# Patient Record
Sex: Female | Born: 1960
Health system: Southern US, Community
[De-identification: ages and names within clinical notes are randomized; demographics above are authoritative.]

## PROBLEM LIST (undated history)

## (undated) DIAGNOSIS — E079 Disorder of thyroid, unspecified: Secondary | ICD-10-CM

## (undated) DIAGNOSIS — M2550 Pain in unspecified joint: Secondary | ICD-10-CM

## (undated) DIAGNOSIS — E7800 Pure hypercholesterolemia, unspecified: Secondary | ICD-10-CM

## (undated) DIAGNOSIS — I10 Essential (primary) hypertension: Secondary | ICD-10-CM

## (undated) DIAGNOSIS — M549 Dorsalgia, unspecified: Secondary | ICD-10-CM

## (undated) HISTORY — DX: Pain in unspecified joint: M25.50

## (undated) HISTORY — DX: Dorsalgia, unspecified: M54.9

## (undated) HISTORY — PX: CHOLECYSTECTOMY: SHX55

## (undated) HISTORY — DX: Pure hypercholesterolemia, unspecified: E78.00

---

## 1998-10-30 HISTORY — PX: CYST EXCISION: SHX5701

## 2013-05-20 ENCOUNTER — Other Ambulatory Visit (HOSPITAL_COMMUNITY)
Admission: RE | Admit: 2013-05-20 | Discharge: 2013-05-20 | Disposition: A | Discharge status: Home / Self Care | Payer: BC Managed Care – PPO | Source: Ambulatory Visit | Attending: Obstetrics and Gynecology | Admitting: Obstetrics and Gynecology

## 2013-05-20 DIAGNOSIS — Z1151 Encounter for screening for human papillomavirus (HPV): Secondary | ICD-10-CM | POA: Insufficient documentation

## 2013-05-20 DIAGNOSIS — Z01419 Encounter for gynecological examination (general) (routine) without abnormal findings: Secondary | ICD-10-CM | POA: Insufficient documentation

## 2014-05-06 ENCOUNTER — Emergency Department (HOSPITAL_COMMUNITY): Payer: BC Managed Care – PPO

## 2014-05-06 ENCOUNTER — Observation Stay (HOSPITAL_COMMUNITY)
Admission: EM | Admit: 2014-05-06 | Discharge: 2014-05-07 | Disposition: A | Discharge status: Home / Self Care | Payer: BC Managed Care – PPO | Attending: Family Medicine | Admitting: Family Medicine

## 2014-05-06 ENCOUNTER — Encounter (HOSPITAL_COMMUNITY): Payer: Self-pay | Admitting: Emergency Medicine

## 2014-05-06 DIAGNOSIS — E039 Hypothyroidism, unspecified: Secondary | ICD-10-CM | POA: Diagnosis present

## 2014-05-06 DIAGNOSIS — I10 Essential (primary) hypertension: Secondary | ICD-10-CM | POA: Diagnosis present

## 2014-05-06 DIAGNOSIS — G40309 Generalized idiopathic epilepsy and epileptic syndromes, not intractable, without status epilepticus: Secondary | ICD-10-CM

## 2014-05-06 DIAGNOSIS — Z79899 Other long term (current) drug therapy: Secondary | ICD-10-CM | POA: Insufficient documentation

## 2014-05-06 DIAGNOSIS — E038 Other specified hypothyroidism: Secondary | ICD-10-CM

## 2014-05-06 DIAGNOSIS — R51 Headache: Secondary | ICD-10-CM | POA: Insufficient documentation

## 2014-05-06 DIAGNOSIS — R55 Syncope and collapse: Principal | ICD-10-CM

## 2014-05-06 HISTORY — DX: Essential (primary) hypertension: I10

## 2014-05-06 HISTORY — DX: Disorder of thyroid, unspecified: E07.9

## 2014-05-06 LAB — CBC WITH DIFFERENTIAL/PLATELET
Basophils Absolute: 0 10*3/uL (ref 0.0–0.1)
Basophils Relative: 0 % (ref 0–1)
EOS ABS: 0 10*3/uL (ref 0.0–0.7)
Eosinophils Relative: 0 % (ref 0–5)
HCT: 38.4 % (ref 36.0–46.0)
Hemoglobin: 12.9 g/dL (ref 12.0–15.0)
LYMPHS PCT: 13 % (ref 12–46)
Lymphs Abs: 1.5 10*3/uL (ref 0.7–4.0)
MCH: 31 pg (ref 26.0–34.0)
MCHC: 33.6 g/dL (ref 30.0–36.0)
MCV: 92.3 fL (ref 78.0–100.0)
MONOS PCT: 3 % (ref 3–12)
Monocytes Absolute: 0.4 10*3/uL (ref 0.1–1.0)
NEUTROS PCT: 84 % — AB (ref 43–77)
Neutro Abs: 9.3 10*3/uL — ABNORMAL HIGH (ref 1.7–7.7)
Platelets: 396 10*3/uL (ref 150–400)
RBC: 4.16 MIL/uL (ref 3.87–5.11)
RDW: 13 % (ref 11.5–15.5)
WBC: 11.2 10*3/uL — ABNORMAL HIGH (ref 4.0–10.5)

## 2014-05-06 LAB — I-STAT TROPONIN, ED: TROPONIN I, POC: 0.01 ng/mL (ref 0.00–0.08)

## 2014-05-06 LAB — BASIC METABOLIC PANEL
Anion gap: 18 — ABNORMAL HIGH (ref 5–15)
BUN: 17 mg/dL (ref 6–23)
CHLORIDE: 98 meq/L (ref 96–112)
CO2: 21 mEq/L (ref 19–32)
Calcium: 9.5 mg/dL (ref 8.4–10.5)
Creatinine, Ser: 0.78 mg/dL (ref 0.50–1.10)
GFR calc non Af Amer: >90 mL/min (ref >90)
GLUCOSE: 92 mg/dL (ref 70–99)
POTASSIUM: 3.5 meq/L — AB (ref 3.7–5.3)
Sodium: 137 mEq/L (ref 137–147)

## 2014-05-06 MED ORDER — ACETAMINOPHEN 325 MG PO TABS
650.0000 mg | ORAL_TABLET | Freq: Once | ORAL | Status: AC
  Administered 2014-05-06: 650 mg via ORAL
  Filled 2014-05-06: qty 2

## 2014-05-06 NOTE — ED Notes (Signed)
Per EMS, pt coming from work today for a syncopal episode. Pt was found by her Co-worker on the ground, pt does not remember what happened. Pt was initially confused but alert x4 upon EMS arrival. NAD at this time. Pt denies pain at this time.  HR:96 BP: 157/104 CBG:196

## 2014-05-06 NOTE — H&P (Signed)
[facility] History and Physical  Sherry Martinez MWN:[id number]:[id number] DOB: 1961-03-27 DOA: 05/06/2014  Referring physician: ER physician. PCP: No primary provider on file.   Chief Complaint: Loss of consciousness.  HPI: Sherry Martinez is a [age] y.o. female with history of hypertension and hypothyroidism had an episode of loss of consciousness while at her workplace. Patient states that around 6 PM she started loss consciousness and there was no prodromal symptoms. When she woke up she was in the ambulance. Patient did not have any focal deficits palpitations chest pain or shortness of breath prior or after the episode. She has been having some frontal headache since fall. She has never had syncopal episodes before. Patient has hypertension and her medications were recently changed last week after patient developed some lower extremity edema after being on amlodipine. Patient was changed to metoprolol. In the ER patient still looked confused but is oriented to time place and name. On exam patient is nonfocal. CT head is negative. EKG shows normal sinus rhythm. Patient had frontal headache which is improving with Tylenol. Patient has no visual symptoms or any difficulty swallowing or speaking. Patient has not had any incontinence of urine or bowels or tongue bite.  Review of Systems: As presented in the history of presenting illness, rest negative.  Past Medical History  Diagnosis Date  . Thyroid disease     hypothyroid  . Hypertension    Past Surgical History  Procedure Laterality Date  . Cholecystectomy     Social History:  reports that she has never smoked. She does not have any smokeless tobacco history on file. She reports that she drinks alcohol. She reports that she does not use illicit drugs. Where does patient live home. Can patient participate in ADLs? Yes.  Allergies  Allergen Reactions  . Augmentin [Amoxicillin-Pot Clavulanate] Hives  . Demerol [Meperidine] Hives     Family History:  Family History  Problem Relation Age of Onset  . CAD Father       Prior to Admission medications   Medication Sig Start Date End Date Taking? Authorizing Provider  hydrochlorothiazide (HYDRODIURIL) 25 MG tablet Take 25 mg by mouth daily. 04/09/14  Yes Historical Provider, MD  metoprolol succinate (TOPROL-XL) 50 MG 24 hr tablet Take 50 mg by mouth daily. 04/29/14  Yes Historical Provider, MD  PREMPHASE 0.625-5 MG TABS Take 1 tablet by mouth daily. 05/04/14  Yes Historical Provider, MD  SYNTHROID 137 MCG tablet Take 137 mcg by mouth daily. 04/14/14  Yes Historical Provider, MD    Physical Exam: Filed Vitals:   05/06/14 2200 05/06/14 2215 05/06/14 2230 05/06/14 2245  BP: 154/74 146/68 150/71 132/97  Pulse: 54 56 60 67  Temp:      TempSrc:      Resp: 14 18 14 23   Height:      Weight:      SpO2: 99% 99% 100% 98%     General:  Well-developed well-nourished.  Eyes: Anicteric no pallor. PERRLA positive.  ENT: No discharge from the ears eyes nose mouth.  Neck: No mass felt. No neck rigidity.  Cardiovascular: S1-S2 heard.  Respiratory: No rhonchi or crepitations.  Abdomen: Soft nontender bowel sounds present. No guarding or rigidity.  Skin: No rash.  Musculoskeletal: No edema.  Psychiatric: Appears normal.  Neurologic: Patient is oriented to time place and person but appears mildly sleepy and confused. Moves all extremities 5 x 5. No facial asymmetry. Tongue is midline. PERRLA positive.  Labs on Admission:  Basic Metabolic Panel:  Recent Labs Lab 05/06/14 2138  NA 137  K 3.5*  CL 98  CO2 21  GLUCOSE 92  BUN 17  CREATININE 0.78  CALCIUM 9.5   Liver Function Tests: No results found for this basename: AST, ALT, ALKPHOS, BILITOT, PROT, ALBUMIN,  in the last 168 hours No results found for this basename: LIPASE, AMYLASE,  in the last 168 hours No results found for this basename: AMMONIA,  in the last 168 hours CBC:  Recent Labs Lab  05/06/14 2138  WBC 11.2*  NEUTROABS 9.3*  HGB 12.9  HCT 38.4  MCV 92.3  PLT 396   Cardiac Enzymes: No results found for this basename: CKTOTAL, CKMB, CKMBINDEX, TROPONINI,  in the last 168 hours  BNP (last 3 results) No results found for this basename: PROBNP,  in the last 8760 hours CBG: No results found for this basename: GLUCAP,  in the last 168 hours  Radiological Exams on Admission: Dg Chest 2 View  05/06/2014   CLINICAL DATA:  Syncope.  EXAM: CHEST  2 VIEW  COMPARISON:  None.  FINDINGS: There is borderline cardiomegaly. Pulmonary vascularity is normal and the lungs are clear. No effusions. No acute osseous abnormality.  IMPRESSION: Borderline cardiomegaly.   Electronically Signed   By: Geanie CooleyJim  Maxwell M.D.   On: 05/06/2014 20:51   Ct Head Wo Contrast  05/06/2014   CLINICAL DATA:  Syncope  EXAM: CT HEAD WITHOUT CONTRAST  TECHNIQUE: Contiguous axial images were obtained from the base of the skull through the vertex without intravenous contrast.  COMPARISON:  None.  FINDINGS: The ventricles and sulci are mildly prominent for age. There is no mass, hemorrhage, extra-axial fluid collection, or midline shift. There is mild small vessel disease in the centra semiovale bilaterally. Elsewhere gray-white compartments appear normal. No acute infarct is apparent. There is ossification in the anterior falx, a finding not felt to have clinical significance. Bony calvarium appears intact. The mastoid air cells are clear. There is a small retention cyst in the inferior right maxillary antrum. There is moderate right sphenoid sinus disease with calcification in this area. There is opacification of a posterior ethmoid air cell on the right.  IMPRESSION: Mild diffuse atrophy with mild patchy periventricular small vessel disease. No intracranial mass, hemorrhage, or acute appearing infarct. There is paranasal sinus disease. Calcification in the right sphenoid sinus suggests chronicity of the paranasal sinus  disease in this region.   Electronically Signed   By: Bretta BangWilliam  Woodruff M.D.   On: 05/06/2014 20:30    EKG: Independently reviewed. Normal sinus rhythm with nonspecific ST-T changes. QTC of 444 ms. QRS of 94 ms.  Assessment/Plan Principal Problem:   HTN (hypertension) Active Problems:   Syncope   Hypothyroidism   1. Syncope - cause not clear. Observe in telemetry for any arrhythmias. Check 2-D echo. Since patient had sudden onset with confusion following the incident we will check MRI brain and EEG for any seizure-like episodes. Since patient also had recent lower extremity edema check d-dimer and if positive check CT angiogram of the chest to rule out PE. Gently hydrate and check orthostatics in a.m. and hold off HCTZ for now. 2. Hypertension - continue metoprolol and we will hold off HCTZ as patient is getting gentle hydration. 3. Hypothyroidism - continue Synthroid.    Code Status: Full code.  Family Communication: None.  Disposition Plan: Admit for observation.    Kennedie Pardoe N. [facility] Pager [phone].  If 7PM-7AM, please contact night-coverage [email or web address] Password [email or web address] 05/06/2014, 11:19  PM        

## 2014-05-06 NOTE — ED Notes (Signed)
Attempted report x1. 

## 2014-05-06 NOTE — ED Provider Notes (Signed)
CSN: [id number]     Arrival date & time 05/06/14  1914 History   First MD Initiated Contact with Patient 05/06/14 1945     Chief Complaint  Patient presents with  . Loss of Consciousness      HPI Per EMS, pt coming from work today for a syncopal episode. Pt was found by her Co-worker on the ground, pt does not remember what happened. Pt was initially confused but alert x4 upon EMS arrival. NAD at this time.  Patient complains of a headache at the present time.  Past Medical History  Diagnosis Date  . Thyroid disease     hypothyroid  . Hypertension    Past Surgical History  Procedure Laterality Date  . Cholecystectomy     No family history on file. History  Substance Use Topics  . Smoking status: Never Smoker   . Smokeless tobacco: Not on file  . Alcohol Use: Yes     Comment: social   OB History   Grav Para Term Preterm Abortions TAB SAB Ect Mult Living                 Review of Systems  All other systems reviewed and are negative  Allergies  Augmentin and Demerol  Home Medications   Prior to Admission medications   Medication Sig Start Date End Date Taking? Authorizing Provider  hydrochlorothiazide (HYDRODIURIL) 25 MG tablet Take 25 mg by mouth daily. 04/09/14  Yes Historical Provider, MD  metoprolol succinate (TOPROL-XL) 50 MG 24 hr tablet Take 50 mg by mouth daily. 04/29/14  Yes Historical Provider, MD  PREMPHASE 0.625-5 MG TABS Take 1 tablet by mouth daily. 05/04/14  Yes Historical Provider, MD  SYNTHROID 137 MCG tablet Take 137 mcg by mouth daily. 04/14/14  Yes Historical Provider, MD   BP 146/68  Pulse 56  Temp(Src) 98.1 F (36.7 C) (Oral)  Resp 18  Ht 5\' 2"  (1.575 m)  Wt 250 lb (113.399 kg)  BMI 45.71 kg/m2  SpO2 99%  LMP 04/29/2014 Physical Exam Physical Exam  Nursing note and vitals reviewed. Constitutional: She is oriented to person, place, and time. She appears well-developed and well-nourished. No distress.  HENT:  Head: Normocephalic and  atraumatic.  Eyes: Pupils are equal, round, and reactive to light.  Neck: Normal range of motion.  Cardiovascular: Normal rate and intact distal pulses.   Pulmonary/Chest: No respiratory distress.  Abdominal: Normal appearance. She exhibits no distension.  Musculoskeletal: Normal range of motion.  Neurological: She is alert and oriented to person, place, and time. No cranial nerve deficit.  no motor weakness. Skin: Skin is warm and dry. No rash noted.  Psychiatric: She has a normal mood and affect. Her behavior is normal.   ED Course  Procedures (including critical care time) Labs Review Labs Reviewed  BASIC METABOLIC PANEL - Abnormal; Notable for the following:    Potassium 3.5 (*)    Anion gap 18 (*)    All other components within normal limits  CBC WITH DIFFERENTIAL - Abnormal; Notable for the following:    WBC 11.2 (*)    Neutrophils Relative % 84 (*)    Neutro Abs 9.3 (*)    All other components within normal limits  Rosezena SensorI-STAT TROPOININ, ED    Imaging Review Dg Chest 2 View  05/06/2014   CLINICAL DATA:  Syncope.  EXAM: CHEST  2 VIEW  COMPARISON:  None.  FINDINGS: There is borderline cardiomegaly. Pulmonary vascularity is normal and the lungs are clear. No  effusions. No acute osseous abnormality.  IMPRESSION: Borderline cardiomegaly.   Electronically Signed   By: Geanie CooleyJim  Maxwell M.D.   On: 05/06/2014 20:51   Ct Head Wo Contrast  05/06/2014   CLINICAL DATA:  Syncope  EXAM: CT HEAD WITHOUT CONTRAST  TECHNIQUE: Contiguous axial images were obtained from the base of the skull through the vertex without intravenous contrast.  COMPARISON:  None.  FINDINGS: The ventricles and sulci are mildly prominent for age. There is no mass, hemorrhage, extra-axial fluid collection, or midline shift. There is mild small vessel disease in the centra semiovale bilaterally. Elsewhere gray-white compartments appear normal. No acute infarct is apparent. There is ossification in the anterior falx, a finding not  felt to have clinical significance. Bony calvarium appears intact. The mastoid air cells are clear. There is a small retention cyst in the inferior right maxillary antrum. There is moderate right sphenoid sinus disease with calcification in this area. There is opacification of a posterior ethmoid air cell on the right.  IMPRESSION: Mild diffuse atrophy with mild patchy periventricular small vessel disease. No intracranial mass, hemorrhage, or acute appearing infarct. There is paranasal sinus disease. Calcification in the right sphenoid sinus suggests chronicity of the paranasal sinus disease in this region.   Electronically Signed   By: Bretta BangWilliam  Woodruff M.D.   On: 05/06/2014 20:30     EKG Interpretation   Date/Time:  Wednesday May 06 2014 19:24:24 EDT Ventricular Rate:  59 PR Interval:  208 QRS Duration: 94 QT Interval:  448 QTC Calculation: 444 R Axis:   10 Text Interpretation:  Sinus rhythm Borderline prolonged PR interval ST  elevation, consider inferior injury No previous tracing Confirmed by  Pier Laux  MD, Angad Nabers ([id number]) on 05/06/2014 8:02:40 PM      MDM   Final diagnoses:  Syncope, unspecified syncope type        Nelia Shiobert L Anyiah Coverdale, MD 05/06/14 2238

## 2014-05-06 NOTE — ED Notes (Signed)
Patient transported to CT 

## 2014-05-07 ENCOUNTER — Observation Stay (HOSPITAL_COMMUNITY): Payer: BC Managed Care – PPO

## 2014-05-07 ENCOUNTER — Encounter (HOSPITAL_COMMUNITY): Payer: Self-pay | Admitting: Radiology

## 2014-05-07 DIAGNOSIS — G40309 Generalized idiopathic epilepsy and epileptic syndromes, not intractable, without status epilepticus: Secondary | ICD-10-CM

## 2014-05-07 DIAGNOSIS — I519 Heart disease, unspecified: Secondary | ICD-10-CM

## 2014-05-07 LAB — RAPID URINE DRUG SCREEN, HOSP PERFORMED
AMPHETAMINES: NOT DETECTED
BARBITURATES: NOT DETECTED
Benzodiazepines: NOT DETECTED
Cocaine: NOT DETECTED
Opiates: NOT DETECTED
TETRAHYDROCANNABINOL: NOT DETECTED

## 2014-05-07 LAB — D-DIMER, QUANTITATIVE (NOT AT ARMC): D DIMER QUANT: 0.84 ug{FEU}/mL — AB (ref 0.00–0.48)

## 2014-05-07 LAB — LACTIC ACID, PLASMA: Lactic Acid, Venous: 1.3 mmol/L (ref 0.5–2.2)

## 2014-05-07 LAB — CK: Total CK: 414 U/L — ABNORMAL HIGH (ref 7–177)

## 2014-05-07 LAB — TROPONIN I: Troponin I: <0.3 ng/mL (ref <0.30)

## 2014-05-07 MED ORDER — SODIUM CHLORIDE 0.9 % IV SOLN
INTRAVENOUS | Status: DC
  Administered 2014-05-07: 01:00:00 via INTRAVENOUS

## 2014-05-07 MED ORDER — IOHEXOL 350 MG/ML SOLN
80.0000 mL | Freq: Once | INTRAVENOUS | Status: AC | PRN
  Administered 2014-05-07: 80 mL via INTRAVENOUS

## 2014-05-07 MED ORDER — ACETAMINOPHEN 325 MG PO TABS: 650.0000 mg | ORAL_TABLET | ORAL | Status: DC | PRN

## 2014-05-07 MED ORDER — LEVOTHYROXINE SODIUM 137 MCG PO TABS
137.0000 ug | ORAL_TABLET | Freq: Every day | ORAL | Status: DC
  Administered 2014-05-07: 137 ug via ORAL
  Filled 2014-05-07 (×2): qty 1

## 2014-05-07 MED ORDER — ONDANSETRON HCL 4 MG/2ML IJ SOLN: 4.0000 mg | Freq: Three times a day (TID) | INTRAMUSCULAR | Status: AC | PRN

## 2014-05-07 MED ORDER — ENOXAPARIN SODIUM 40 MG/0.4ML ~~LOC~~ SOLN
40.0000 mg | SUBCUTANEOUS | Status: DC
  Administered 2014-05-07: 40 mg via SUBCUTANEOUS
  Filled 2014-05-07: qty 0.4

## 2014-05-07 MED ORDER — STROKE: EARLY STAGES OF RECOVERY BOOK
Freq: Once | Status: AC
  Administered 2014-05-07: 08:00:00
  Filled 2014-05-07: qty 1

## 2014-05-07 MED ORDER — METOPROLOL SUCCINATE ER 50 MG PO TB24
50.0000 mg | ORAL_TABLET | Freq: Every day | ORAL | Status: DC
  Administered 2014-05-07: 50 mg via ORAL
  Filled 2014-05-07: qty 1

## 2014-05-07 NOTE — Progress Notes (Signed)
Pt left AMA, risks explained to her, she stated she wanted a second opinion, she refused to wait for dc paperwork. Md made aware

## 2014-05-07 NOTE — Procedures (Signed)
History: 53 yo F with LOC of unclear etiology.   Sedation: None  Technique: This is a 17 channel routine scalp EEG performed at the bedside with bipolar and monopolar montages arranged in accordance to the international 10/20 system of electrode placement. One channel was dedicated to EKG recording.    Background: There is a well defined posterior dominant rhythm of 10 Hz that attenuates with eye opening. There are frequent discharges consisting of a series of generalized poly-spike-and wave discharges with a bifrontal predominance lasting from 1 - 4 seconds.   Photic stimulation: Physiologic driving is present  EEG Abnormalities: 1) Generalized polyspike-and-wave discharges  Clinical Interpretation: This EEG is consistent with the interictal expression of a generalized epilepsy. No seizures were recorded.   Sherry SlotMcNeill Kirkpatrick, MD [facility] [phone]  If 7pm- 7am, please page neurology on call as listed in AMION.

## 2014-05-07 NOTE — Progress Notes (Signed)
Pt in procedure at 1000, unable to perform neuro check, obtained upon arrival back to floor, WNL will continue to monitor.

## 2014-05-07 NOTE — Progress Notes (Signed)
EEG Completed; Results Pending  

## 2014-05-07 NOTE — Consult Note (Addendum)
NEURO HOSPITALIST CONSULT NOTE    Reason for Consult: syncope versus seizure.   HPI:                                                                                                                                          Sherry Martinez is an 53 y.o. female who was brought to hospital after she was noted to have a syncopal episode at work.  Patient recalls being in her office and then being in the ambulance. Patient denies having any previous episodes such as this, or episodes of lost time.  She does have a son with Epilepsy which started at age 25. She denies having febrile seizures as a child or head trauma in the past. Currently she is awake and oriented able to hold a full conversation.   Past Medical History  Diagnosis Date  . Thyroid disease     hypothyroid  . Hypertension     Past Surgical History  Procedure Laterality Date  . Cholecystectomy      Family History  Problem Relation Age of Onset  . CAD Father      Social History:  reports that she has never smoked. She does not have any smokeless tobacco history on file. She reports that she drinks alcohol. She reports that she does not use illicit drugs.  Allergies  Allergen Reactions  . Augmentin [Amoxicillin-Pot Clavulanate] Hives  . Demerol [Meperidine] Hives    MEDICATIONS:                                                                                                                     Prior to Admission:  Prescriptions prior to admission  Medication Sig Dispense Refill  . hydrochlorothiazide (HYDRODIURIL) 25 MG tablet Take 25 mg by mouth daily.      . metoprolol succinate (TOPROL-XL) 50 MG 24 hr tablet Take 50 mg by mouth daily.      Marland Kitchen PREMPHASE 0.625-5 MG TABS Take 1 tablet by mouth daily.      Marland Kitchen SYNTHROID 137 MCG tablet Take 137 mcg by mouth daily.       Scheduled: . enoxaparin (LOVENOX) injection  40 mg Subcutaneous Q24H  . levothyroxine  137 mcg Oral QAC breakfast  . metoprolol  succinate  50 mg Oral  Daily     ROS:                                                                                                                                       History obtained from the patient  General ROS: negative for - chills, fatigue, fever, night sweats, weight gain or weight loss Psychological ROS: negative for - behavioral disorder, hallucinations, memory difficulties, mood swings or suicidal ideation Ophthalmic ROS: negative for - blurry vision, double vision, eye pain or loss of vision ENT ROS: negative for - epistaxis, nasal discharge, oral lesions, sore throat, tinnitus or vertigo Allergy and Immunology ROS: negative for - hives or itchy/watery eyes Hematological and Lymphatic ROS: negative for - bleeding problems, bruising or swollen lymph nodes Endocrine ROS: negative for - galactorrhea, hair pattern changes, polydipsia/polyuria or temperature intolerance Respiratory ROS: negative for - cough, hemoptysis, shortness of breath or wheezing Cardiovascular ROS: negative for - chest pain, dyspnea on exertion, edema or irregular heartbeat Gastrointestinal ROS: negative for - abdominal pain, diarrhea, hematemesis, nausea/vomiting or stool incontinence Genito-Urinary ROS: negative for - dysuria, hematuria, incontinence or urinary frequency/urgency Musculoskeletal ROS: negative for - joint swelling or muscular weakness Neurological ROS: as noted in HPI Dermatological ROS: negative for rash and skin lesion changes   Blood pressure 137/97, pulse 63, temperature 98.4 F (36.9 C), temperature source Oral, resp. rate 18, height 5\' 2"  (1.575 m), weight 118.071 kg (260 lb 4.8 oz), last menstrual period 04/29/2014, SpO2 97.00%.   Neurologic Examination:                                                                                                      General: Mental Status: Alert, oriented, thought content appropriate.  Speech fluent without evidence of aphasia.  Able to follow  3 step commands without difficulty. Cranial Nerves: II: Discs flat bilaterally; Visual fields grossly normal, pupils equal, round, reactive to light and accommodation III,IV, VI: ptosis not present, extra-ocular motions intact bilaterally V,VII: smile symmetric, facial light touch sensation normal bilaterally VIII: hearing normal bilaterally IX,X: gag reflex present XI: bilateral shoulder shrug XII: midline tongue extension without atrophy or fasciculations  Motor: Right : Upper extremity   5/5    Left:     Upper extremity   5/5  Lower extremity   5/5     Lower extremity   5/5 Tone and bulk:normal tone throughout; no atrophy noted Sensory: Pinprick and light touch intact throughout, bilaterally Deep Tendon Reflexes:  Right: Upper  Extremity   Left: Upper extremity   biceps (C-5 to C-6) 2/4   biceps (C-5 to C-6) 2/4 tricep (C7) 2/4    triceps (C7) 2/4 Brachioradialis (C6) 2/4  Brachioradialis (C6) 2/4  Lower Extremity Lower Extremity  quadriceps (L-2 to L-4) 2/4   quadriceps (L-2 to L-4) 2/4 Achilles (S1) 0/4   Achilles (S1) 0/4  Plantars: Right: downgoing   Left: downgoing Cerebellar: normal finger-to-nose,  normal heel-to-shin test Gait: not tested.  CV: pulses palpable throughout    Lab Results: Basic Metabolic Panel:  Recent Labs Lab 05/06/14 2138  NA 137  K 3.5*  CL 98  CO2 21  GLUCOSE 92  BUN 17  CREATININE 0.78  CALCIUM 9.5    Liver Function Tests: No results found for this basename: AST, ALT, ALKPHOS, BILITOT, PROT, ALBUMIN,  in the last 168 hours No results found for this basename: LIPASE, AMYLASE,  in the last 168 hours No results found for this basename: AMMONIA,  in the last 168 hours  CBC:  Recent Labs Lab 05/06/14 2138  WBC 11.2*  NEUTROABS 9.3*  HGB 12.9  HCT 38.4  MCV 92.3  PLT 396    Cardiac Enzymes:  Recent Labs Lab 05/07/14 0102  CKTOTAL 414*  TROPONINI <0.30    Lipid Panel: No results found for this basename: CHOL, TRIG,  HDL, CHOLHDL, VLDL, LDLCALC,  in the last 168 hours  CBG: No results found for this basename: GLUCAP,  in the last 168 hours  Microbiology: No results found for this or any previous visit.  Coagulation Studies: No results found for this basename: LABPROT, INR,  in the last 72 hours  Imaging: Dg Chest 2 View  05/06/2014   CLINICAL DATA:  Syncope.  EXAM: CHEST  2 VIEW  COMPARISON:  None.  FINDINGS: There is borderline cardiomegaly. Pulmonary vascularity is normal and the lungs are clear. No effusions. No acute osseous abnormality.  IMPRESSION: Borderline cardiomegaly.   Electronically Signed   By: Geanie CooleyJim  Maxwell M.D.   On: 05/06/2014 20:51   Ct Head Wo Contrast  05/06/2014   CLINICAL DATA:  Syncope  EXAM: CT HEAD WITHOUT CONTRAST  TECHNIQUE: Contiguous axial images were obtained from the base of the skull through the vertex without intravenous contrast.  COMPARISON:  None.  FINDINGS: The ventricles and sulci are mildly prominent for age. There is no mass, hemorrhage, extra-axial fluid collection, or midline shift. There is mild small vessel disease in the centra semiovale bilaterally. Elsewhere gray-white compartments appear normal. No acute infarct is apparent. There is ossification in the anterior falx, a finding not felt to have clinical significance. Bony calvarium appears intact. The mastoid air cells are clear. There is a small retention cyst in the inferior right maxillary antrum. There is moderate right sphenoid sinus disease with calcification in this area. There is opacification of a posterior ethmoid air cell on the right.  IMPRESSION: Mild diffuse atrophy with mild patchy periventricular small vessel disease. No intracranial mass, hemorrhage, or acute appearing infarct. There is paranasal sinus disease. Calcification in the right sphenoid sinus suggests chronicity of the paranasal sinus disease in this region.   Electronically Signed   By: Bretta BangWilliam  Woodruff M.D.   On: 05/06/2014 20:30   Ct  Angio Chest Pe W/cm &/or Wo Cm  05/07/2014   CLINICAL DATA:  Syncope.  Elevated D-dimer.  EXAM: CT ANGIOGRAPHY CHEST WITH CONTRAST  TECHNIQUE: Multidetector CT imaging of the chest was performed using the standard protocol during bolus administration of  intravenous contrast. Multiplanar CT image reconstructions and MIPs were obtained to evaluate the vascular anatomy.  CONTRAST:  80mL OMNIPAQUE IOHEXOL 350 MG/ML SOLN  COMPARISON:  None.  FINDINGS: Technically adequate study with good opacification of the central and segmental pulmonary arteries. No focal filling defects. No evidence of significant pulmonary embolus.  Normal heart size. Normal caliber thoracic aorta. Great vessel origins are patent. Esophagus is decompressed. No significant lymphadenopathy in the chest. No pleural effusions. Visualized portions of the upper abdominal organs are grossly unremarkable. Visualization of lungs is limited due to respiratory motion artifact but no focal consolidation or pneumothorax is identified. Suggestion of mild dependent atelectasis in the lung bases. Degenerative changes in the thoracic spine.  Review of the MIP images confirms the above findings.  IMPRESSION: No evidence of significant pulmonary embolus.   Electronically Signed   By: Burman Nieves M.D.   On: 05/07/2014 05:41   Mr Laqueta Jean UE Contrast  05/07/2014   CLINICAL DATA:  Loss of consciousness, possible seizure.  EXAM: MRI HEAD WITHOUT AND WITH CONTRAST  TECHNIQUE: Multiplanar, multiecho pulse sequences of the brain and surrounding structures were obtained without and with intravenous contrast.  CONTRAST:  MultiHance 20 mL.  COMPARISON:  CT head 05/06/2014.  FINDINGS: No evidence for acute infarction, hemorrhage, mass lesion, hydrocephalus, or extra-axial fluid. Premature for age cerebral and cerebellar atrophy. Mild subcortical and periventricular T2 and FLAIR hyperintensities, likely chronic microvascular ischemic change. Unremarkable pituitary and  cerebellar tonsils. No cervical spine abnormality. Flow voids are maintained. No foci of chronic hemorrhage. Post infusion, there is no abnormal enhancement of the brain or meninges. Moderately advanced ossification of the falx anteriorly.  The sphenoid sinus is abnormal. There is significant filling with fluid and an associated central calcification. Slight air-fluid level also noted. Peripheral enhancement. No other significant sinus disease. Negative middle ear and mastoids. Negative orbits. Scalp soft tissues unremarkable.  IMPRESSION: Atrophy and small vessel disease.  No acute intracranial findings.  No intracranial mass lesion or areas of restricted diffusion to suggest an ictal focus.  Chronic and acute sphenoid sinusitis. ENT consultation may be warranted.  Similar appearance to prior CT.   Electronically Signed   By: Davonna Belling M.D.   On: 05/07/2014 11:22    EEG: preliminary--shows signs of idiopathic generalized epilepsy--final report to follow.    Assessment and plan per attending neurologist  Felicie Morn PA-C [facility] [phone]  05/07/2014, 12:23 PM  I have seen and evaluated the patient. I have reviewed the above note and made appropriate changes.   Assessment/Plan:  53 YO female brought to hospital after unexplained loss of consciousness. Given the amnestic duration, I do suspect seizure in the setting of her abnormal EEG. EEG shows signs of generalized epilepsy. At this time with EEG findings and episode of LOC, I would favor treating with Keppra 500 mg BID.   Recommend: 1) Keppra 500 mg BID 2) Follow up with Dr. Karel Jarvis 05/18/14 09:30AM  Patient is unable to drive, operate heavy machinery, perform activities at heights or participate in water activities until release by outpatient physician. This was discussed with the patient who expressed understanding.     Ritta Slot, MD [facility] [phone]  If 7pm- 7am, please page  neurology on call as listed in AMION.

## 2014-05-07 NOTE — Progress Notes (Signed)
UR Completed Aqil Goetting Graves-Bigelow, RN,BSN [phone]  

## 2014-05-07 NOTE — Progress Notes (Signed)
  Echocardiogram 2D Echocardiogram has been performed.  Sabena Winner, Chrissy 05/07/2014, 8:42 AM

## 2014-05-18 ENCOUNTER — Ambulatory Visit: Payer: BC Managed Care – PPO | Admitting: Neurology

## 2014-05-19 ENCOUNTER — Encounter: Payer: Self-pay | Admitting: Neurology

## 2014-05-21 NOTE — Discharge Summary (Signed)
Chief complaint - loss of consciousness. Course in the hospital - 53 year-old female with history of hypertension and hypothyroidism had an episode of loss of consciousness at her workplace and was brought to the ER. Patient had CT head which did not show anything acute and cardiac markers and EKG were unremarkable. On exam patient was nonfocal. Patient had mildly elevated d-dimer and CT angiogram of the chest was negative for pulmonary embolism. 2-D echo was unremarkable. Patient had EEG which was showing abnormal features concerning for seizures. On-call neurologist Dr. Amada JupiterKirkpatrick was consulted and patient was started on Keppra but patient left hospital AGAINST MEDICAL ADVICE as patient wanted to take a second opinion.  Discharge diagnosis -   #1. Seizures. #2. Hypertension. #3. Hypothyroidism.  Patient left hospital against medical advice.   Eduard ClosArshad N Julee Stoll

## 2014-06-10 ENCOUNTER — Other Ambulatory Visit: Payer: Self-pay | Admitting: Obstetrics and Gynecology

## 2014-06-10 DIAGNOSIS — Z1231 Encounter for screening mammogram for malignant neoplasm of breast: Secondary | ICD-10-CM

## 2014-06-15 ENCOUNTER — Ambulatory Visit: Payer: BC Managed Care – PPO

## 2014-06-24 ENCOUNTER — Ambulatory Visit
Admission: RE | Admit: 2014-06-24 | Discharge: 2014-06-24 | Disposition: A | Discharge status: Home / Self Care | Payer: BC Managed Care – PPO | Source: Ambulatory Visit | Attending: Obstetrics and Gynecology | Admitting: Obstetrics and Gynecology

## 2014-06-24 DIAGNOSIS — Z1231 Encounter for screening mammogram for malignant neoplasm of breast: Secondary | ICD-10-CM

## 2017-01-12 ENCOUNTER — Other Ambulatory Visit: Payer: Self-pay | Admitting: Family Medicine

## 2017-01-12 ENCOUNTER — Ambulatory Visit
Admission: RE | Admit: 2017-01-12 | Discharge: 2017-01-12 | Disposition: A | Discharge status: Home / Self Care | Payer: 59 | Source: Ambulatory Visit | Attending: Family Medicine | Admitting: Family Medicine

## 2017-01-12 DIAGNOSIS — W19XXXA Unspecified fall, initial encounter: Secondary | ICD-10-CM

## 2017-01-12 DIAGNOSIS — R52 Pain, unspecified: Secondary | ICD-10-CM

## 2017-11-09 ENCOUNTER — Other Ambulatory Visit: Payer: Self-pay | Admitting: Nurse Practitioner

## 2017-11-09 DIAGNOSIS — Z1231 Encounter for screening mammogram for malignant neoplasm of breast: Secondary | ICD-10-CM

## 2017-11-16 ENCOUNTER — Ambulatory Visit
Admission: RE | Admit: 2017-11-16 | Discharge: 2017-11-16 | Disposition: A | Discharge status: Home / Self Care | Payer: 59 | Source: Ambulatory Visit | Attending: Nurse Practitioner | Admitting: Nurse Practitioner

## 2017-11-16 DIAGNOSIS — Z1231 Encounter for screening mammogram for malignant neoplasm of breast: Secondary | ICD-10-CM

## 2017-11-27 ENCOUNTER — Other Ambulatory Visit: Payer: Self-pay | Admitting: Obstetrics and Gynecology

## 2017-11-27 ENCOUNTER — Other Ambulatory Visit (HOSPITAL_COMMUNITY)
Admission: RE | Admit: 2017-11-27 | Discharge: 2017-11-27 | Disposition: A | Discharge status: Home / Self Care | Payer: 59 | Source: Ambulatory Visit | Attending: Obstetrics and Gynecology | Admitting: Obstetrics and Gynecology

## 2017-11-27 DIAGNOSIS — Z124 Encounter for screening for malignant neoplasm of cervix: Secondary | ICD-10-CM | POA: Diagnosis present

## 2017-11-29 LAB — CYTOLOGY - PAP
DIAGNOSIS: NEGATIVE
HPV: NOT DETECTED

## 2018-08-23 ENCOUNTER — Other Ambulatory Visit: Payer: Self-pay | Admitting: Nurse Practitioner

## 2018-08-23 ENCOUNTER — Ambulatory Visit
Admission: RE | Admit: 2018-08-23 | Discharge: 2018-08-23 | Disposition: A | Discharge status: Home / Self Care | Payer: 59 | Source: Ambulatory Visit | Attending: Nurse Practitioner | Admitting: Nurse Practitioner

## 2018-08-23 DIAGNOSIS — M5386 Other specified dorsopathies, lumbar region: Secondary | ICD-10-CM

## 2018-08-31 ENCOUNTER — Ambulatory Visit
Admission: RE | Admit: 2018-08-31 | Discharge: 2018-08-31 | Disposition: A | Discharge status: Home / Self Care | Payer: 59 | Source: Ambulatory Visit | Attending: Nurse Practitioner | Admitting: Nurse Practitioner

## 2018-08-31 DIAGNOSIS — M5386 Other specified dorsopathies, lumbar region: Secondary | ICD-10-CM

## 2018-11-02 DIAGNOSIS — N10 Acute tubulo-interstitial nephritis: Secondary | ICD-10-CM | POA: Diagnosis not present

## 2018-11-12 DIAGNOSIS — M545 Low back pain: Secondary | ICD-10-CM | POA: Diagnosis not present

## 2018-11-12 DIAGNOSIS — M4317 Spondylolisthesis, lumbosacral region: Secondary | ICD-10-CM | POA: Diagnosis not present

## 2018-12-13 DIAGNOSIS — M4317 Spondylolisthesis, lumbosacral region: Secondary | ICD-10-CM | POA: Diagnosis not present

## 2018-12-13 DIAGNOSIS — I10 Essential (primary) hypertension: Secondary | ICD-10-CM | POA: Diagnosis not present

## 2018-12-13 DIAGNOSIS — M545 Low back pain: Secondary | ICD-10-CM | POA: Diagnosis not present

## 2018-12-13 DIAGNOSIS — Z6841 Body Mass Index (BMI) 40.0 and over, adult: Secondary | ICD-10-CM | POA: Diagnosis not present

## 2019-01-13 DIAGNOSIS — Z13220 Encounter for screening for lipoid disorders: Secondary | ICD-10-CM | POA: Diagnosis not present

## 2019-01-13 DIAGNOSIS — E039 Hypothyroidism, unspecified: Secondary | ICD-10-CM | POA: Diagnosis not present

## 2019-01-13 DIAGNOSIS — I10 Essential (primary) hypertension: Secondary | ICD-10-CM | POA: Diagnosis not present

## 2019-05-12 DIAGNOSIS — G4733 Obstructive sleep apnea (adult) (pediatric): Secondary | ICD-10-CM | POA: Diagnosis not present

## 2019-05-12 DIAGNOSIS — E782 Mixed hyperlipidemia: Secondary | ICD-10-CM | POA: Diagnosis not present

## 2019-05-12 DIAGNOSIS — E039 Hypothyroidism, unspecified: Secondary | ICD-10-CM | POA: Diagnosis not present

## 2019-05-12 DIAGNOSIS — I10 Essential (primary) hypertension: Secondary | ICD-10-CM | POA: Diagnosis not present

## 2019-05-29 DIAGNOSIS — M4317 Spondylolisthesis, lumbosacral region: Secondary | ICD-10-CM | POA: Diagnosis not present

## 2019-05-29 DIAGNOSIS — M545 Low back pain: Secondary | ICD-10-CM | POA: Diagnosis not present

## 2019-07-30 ENCOUNTER — Other Ambulatory Visit: Payer: Self-pay | Admitting: Internal Medicine

## 2019-07-30 DIAGNOSIS — I10 Essential (primary) hypertension: Secondary | ICD-10-CM | POA: Diagnosis not present

## 2019-07-30 DIAGNOSIS — Z23 Encounter for immunization: Secondary | ICD-10-CM | POA: Diagnosis not present

## 2019-07-30 DIAGNOSIS — Z1231 Encounter for screening mammogram for malignant neoplasm of breast: Secondary | ICD-10-CM

## 2019-07-30 DIAGNOSIS — Z6841 Body Mass Index (BMI) 40.0 and over, adult: Secondary | ICD-10-CM | POA: Diagnosis not present

## 2019-07-30 DIAGNOSIS — E782 Mixed hyperlipidemia: Secondary | ICD-10-CM | POA: Diagnosis not present

## 2019-09-11 ENCOUNTER — Ambulatory Visit: Payer: 59

## 2019-11-04 DIAGNOSIS — Z01419 Encounter for gynecological examination (general) (routine) without abnormal findings: Secondary | ICD-10-CM | POA: Diagnosis not present

## 2019-11-06 ENCOUNTER — Ambulatory Visit
Admission: RE | Admit: 2019-11-06 | Discharge: 2019-11-06 | Disposition: A | Discharge status: Home / Self Care | Payer: BC Managed Care – PPO | Source: Ambulatory Visit | Attending: Internal Medicine | Admitting: Internal Medicine

## 2019-11-06 ENCOUNTER — Other Ambulatory Visit: Payer: Self-pay

## 2019-11-06 DIAGNOSIS — Z1231 Encounter for screening mammogram for malignant neoplasm of breast: Secondary | ICD-10-CM

## 2020-02-25 ENCOUNTER — Other Ambulatory Visit: Payer: Self-pay

## 2020-02-25 ENCOUNTER — Ambulatory Visit: Payer: BC Managed Care – PPO | Attending: Internal Medicine

## 2020-02-25 DIAGNOSIS — Z23 Encounter for immunization: Secondary | ICD-10-CM

## 2020-02-25 NOTE — Progress Notes (Signed)
   Covid-19 Vaccination Clinic  Name:  Sherry Martinez    MRN: [id number] DOB: 10-Dec-1960  02/25/2020  Sherry Martinez was observed post Covid-19 immunization for 15 minutes without incident. She was provided with Vaccine Information Sheet and instruction to access the V-Safe system.   Sherry Martinez was instructed to call 911 with any severe reactions post vaccine: Marland Kitchen Difficulty breathing  . Swelling of face and throat  . A fast heartbeat  . A bad rash all over body  . Dizziness and weakness   Immunizations Administered    Name Date Dose VIS Date Route   Pfizer COVID-19 Vaccine 02/25/2020  3:41 PM 0.3 mL 12/24/2018 Intramuscular   Manufacturer: [employer], [employer]   Lot: [id number]   NDC: [address]-5

## 2020-03-16 ENCOUNTER — Ambulatory Visit: Payer: BC Managed Care – PPO | Attending: Internal Medicine

## 2020-03-16 DIAGNOSIS — Z23 Encounter for immunization: Secondary | ICD-10-CM

## 2020-03-16 NOTE — Progress Notes (Signed)
   Covid-19 Vaccination Clinic  Name:  [name]    MRN: [id number] DOB: [birth year]  03/16/2020  [name] was observed post Covid-19 immunization for 15 minutes without incident. She was provided with Vaccine Information Sheet and instruction to access the V-Safe system.   [name] was instructed to call 911 with any severe reactions post vaccine: Marland Kitchen Difficulty breathing  . Swelling of face and throat  . A fast heartbeat  . A bad rash all over body  . Dizziness and weakness   Immunizations Administered    Name Date Dose VIS Date Route   Pfizer COVID-19 Vaccine 03/16/2020  1:07 PM 0.3 mL 12/24/2018 Intramuscular   Manufacturer: [employer], [employer]   Lot: [id number]   NDC: [address]-3

## 2020-05-17 ENCOUNTER — Other Ambulatory Visit: Payer: Self-pay

## 2020-05-17 ENCOUNTER — Ambulatory Visit (HOSPITAL_COMMUNITY)
Admission: EM | Admit: 2020-05-17 | Discharge: 2020-05-17 | Disposition: A | Discharge status: Home / Self Care | Payer: BC Managed Care – PPO | Attending: Family Medicine | Admitting: Family Medicine

## 2020-05-17 ENCOUNTER — Encounter (HOSPITAL_COMMUNITY): Payer: Self-pay

## 2020-05-17 ENCOUNTER — Ambulatory Visit (INDEPENDENT_AMBULATORY_CARE_PROVIDER_SITE_OTHER): Payer: BC Managed Care – PPO

## 2020-05-17 DIAGNOSIS — S62627D Displaced fracture of medial phalanx of left little finger, subsequent encounter for fracture with routine healing: Secondary | ICD-10-CM | POA: Diagnosis not present

## 2020-05-17 DIAGNOSIS — S63286D Dislocation of proximal interphalangeal joint of right little finger, subsequent encounter: Secondary | ICD-10-CM | POA: Diagnosis not present

## 2020-05-17 DIAGNOSIS — S6981XA Other specified injuries of right wrist, hand and finger(s), initial encounter: Secondary | ICD-10-CM

## 2020-05-17 DIAGNOSIS — S63296A Dislocation of distal interphalangeal joint of right little finger, initial encounter: Secondary | ICD-10-CM

## 2020-05-17 DIAGNOSIS — S63639A Sprain of interphalangeal joint of unspecified finger, initial encounter: Secondary | ICD-10-CM | POA: Diagnosis not present

## 2020-05-17 DIAGNOSIS — I10 Essential (primary) hypertension: Secondary | ICD-10-CM | POA: Diagnosis not present

## 2020-05-17 DIAGNOSIS — S63286A Dislocation of proximal interphalangeal joint of right little finger, initial encounter: Secondary | ICD-10-CM | POA: Diagnosis not present

## 2020-05-17 DIAGNOSIS — S63259A Unspecified dislocation of unspecified finger, initial encounter: Secondary | ICD-10-CM | POA: Diagnosis not present

## 2020-05-17 NOTE — ED Triage Notes (Signed)
Pt states she fell this morning onto her right side on pavement and right hand got caught under a large trash can. Pt c/o pain to right 5th finger. Denies head injury/LOC. Right pinky finger grossly displaced with small area of blood noted to medial aspect. Denies pain to other fingers/hand.

## 2020-05-17 NOTE — Discharge Instructions (Addendum)
Wear your finger splint for the next few days. You may then remove this and begin to work on moving your finger. If not allergic, you may use over the counter ibuprofen or acetaminophen as needed for discomfort.   Your blood pressure was noted to be elevated during your visit today. If you are currently taking medication for high blood pressure, please ensure you are taking this as directed. If you do not have a history of high blood pressure and your blood pressure remains persistently elevated, you may need to begin taking a medication at some point. You may return here within the next few days to recheck if unable to see your primary care provider or if do not have a one.  BP (!) 175/123 (BP Location: Left Wrist)   Pulse 70   Temp 99.2 F (37.3 C) (Oral)   Resp 18   LMP 04/29/2014   SpO2 100%

## 2020-05-17 NOTE — ED Provider Notes (Signed)
[facility]   [id number] 05/17/20 Arrival Time: 0160  ASSESSMENT & PLAN:  1. Dislocation of finger, initial encounter   2. Volar plate injury of finger, initial encounter   3. Elevated blood pressure reading in office with diagnosis of hypertension     I have personally viewed the imaging studies ordered this visit. R fifth finger dislocation at PIP. Post-reduction with apparent volar plate/avulsion injury.  Procedure: Skin of R fifth finger cleaned. Using 1% lidocaine, a digital block was performed. Finger dislocation reduced with gentle traction. No complications.  Finger splint applied. To wear for next few days then work on ROM. OTC analgesics if needed.  Recommend:  Follow-up Information    Dominica Severin, MD.   Specialty: Orthopedic Surgery Why: If worsening or failing to improve as anticipated. Contact information: [address] [place] [address] [phone]                 Discharge Instructions     Wear your finger splint for the next few days. You may then remove this and begin to work on moving your finger. If not allergic, you may use over the counter ibuprofen or acetaminophen as needed for discomfort.   Your blood pressure was noted to be elevated during your visit today. If you are currently taking medication for high blood pressure, please ensure you are taking this as directed. If you do not have a history of high blood pressure and your blood pressure remains persistently elevated, you may need to begin taking a medication at some point. You may return here within the next few days to recheck if unable to see your primary care provider or if do not have a one.  BP (!) 175/123 (BP Location: Left Wrist)   Pulse 70   Temp 99.2 F (37.3 C) (Oral)   Resp 18   LMP 04/29/2014   SpO2 100%        Reviewed expectations re: course of current medical issues. Questions answered. Outlined signs and symptoms indicating  need for more acute intervention. Patient verbalized understanding. After Visit Summary given.  SUBJECTIVE: History from: patient. Sherry Martinez is a 59 y.o. female who reports falling today; hand vs trash can; "finger bent" now. Is painful. Cannot flex R 5th finger. No sensation changes.  Past Surgical History:  Procedure Laterality Date  . CHOLECYSTECTOMY       Increased blood pressure noted today. Reports that she is treated for HTN.  She reports taking medications as instructed, no chest pain on exertion, no dyspnea on exertion, no swelling of ankles, no orthostatic dizziness or lightheadedness, no orthopnea or paroxysmal nocturnal dyspnea and no palpitations.  OBJECTIVE:  Vitals:   05/17/20 0934  BP: (!) 175/123  Pulse: 70  Resp: 18  Temp: 99.2 F (37.3 C)  TempSrc: Oral  SpO2: 100%    General appearance: alert; no distress HEENT: [place]; AT Neck: supple with FROM Resp: unlabored respirations Extremities: . RUE: warm with well perfused appearance; obvious 5th finger dislocation with finger protruding at angle at PIP; very TTP; able to flex finger after finger reduction CV: brisk extremity capillary refill of RUE; 2+ radial pulse of RUE. Skin: warm and dry; no visible rashes Neurologic: gait normal; normal sensation and strength of RUE Psychological: alert and cooperative; normal mood and affect  Imaging: DG Hand Complete Right  Result Date: 05/17/2020 CLINICAL DATA:  Recent fall with fifth digit deformity, initial encounter EXAM: RIGHT HAND - COMPLETE 3+ VIEW COMPARISON:  None. FINDINGS: There is posterior dislocation of the fifth middle phalanx at the level of the PIP joint. No definitive fracture is seen. No other focal abnormality is noted. IMPRESSION: Posterior dislocation at the fifth PIP joint. Electronically Signed   By: Alcide Clever M.D.   On: 05/17/2020 09:59   DG Finger Little Right  Result Date: 05/17/2020 CLINICAL DATA:  Status post reduction of PIP  dislocation. EXAM: RIGHT LITTLE FINGER 2+V COMPARISON:  05/17/2020 at 9:50 a.m. FINDINGS: The small finger PIP joint dislocation on the prior study has been reduced. There is a 1 mm bone fragment at the volar aspect of the PIP joint. IMPRESSION: Interval reduction of small finger PIP joint dislocation. Tiny fracture fragment at the volar aspect of the PIP joint. Electronically Signed   By: Sebastian Ache M.D.   On: 05/17/2020 10:56      Allergies  Allergen Reactions  . Augmentin [Amoxicillin-Pot Clavulanate] Hives  . Demerol [Meperidine] Hives    Past Medical History:  Diagnosis Date  . Hypertension   . Thyroid disease    hypothyroid   Social History   Socioeconomic History  . Marital status: Single    Spouse name: Not on file  . Number of children: Not on file  . Years of education: Not on file  . Highest education level: Not on file  Occupational History  . Not on file  Tobacco Use  . Smoking status: Never Smoker  Vaping Use  . Vaping Use: Never used  Substance and Sexual Activity  . Alcohol use: Yes    Comment: social  . Drug use: No  . Sexual activity: Not on file  Other Topics Concern  . Not on file  Social History Narrative  . Not on file   Social Determinants of Health   Financial Resource Strain:   . Difficulty of Paying Living Expenses:   Food Insecurity:   . Worried About Programme researcher, broadcasting/film/video in the Last Year:   . Barista in the Last Year:   Transportation Needs:   . Freight forwarder (Medical):   Marland Kitchen Lack of Transportation (Non-Medical):   Physical Activity:   . Days of Exercise per Week:   . Minutes of Exercise per Session:   Stress:   . Feeling of Stress :   Social Connections:   . Frequency of Communication with Friends and Family:   . Frequency of Social Gatherings with Friends and Family:   . Attends Religious Services:   . Active Member of Clubs or Organizations:   . Attends Banker Meetings:   Marland Kitchen Marital Status:     Family History  Problem Relation Age of Onset  . CAD Father    Past Surgical History:  Procedure Laterality Date  . Barrington Ellison, MD 05/17/20 1122

## 2020-05-17 NOTE — ED Notes (Signed)
Offered pt opportunity to request pain reliever such as ibuprofen/tylenol from provider. Pt declined at this time.

## 2020-08-02 DIAGNOSIS — E782 Mixed hyperlipidemia: Secondary | ICD-10-CM | POA: Diagnosis not present

## 2020-08-02 DIAGNOSIS — Z0000 Encounter for general adult medical examination without abnormal findings: Secondary | ICD-10-CM | POA: Diagnosis not present

## 2020-08-02 DIAGNOSIS — Z23 Encounter for immunization: Secondary | ICD-10-CM | POA: Diagnosis not present

## 2020-09-16 DIAGNOSIS — M4317 Spondylolisthesis, lumbosacral region: Secondary | ICD-10-CM | POA: Diagnosis not present

## 2020-09-16 DIAGNOSIS — Z6841 Body Mass Index (BMI) 40.0 and over, adult: Secondary | ICD-10-CM | POA: Diagnosis not present

## 2020-09-16 DIAGNOSIS — I10 Essential (primary) hypertension: Secondary | ICD-10-CM | POA: Diagnosis not present

## 2020-10-07 DIAGNOSIS — M48061 Spinal stenosis, lumbar region without neurogenic claudication: Secondary | ICD-10-CM | POA: Diagnosis not present

## 2020-10-07 DIAGNOSIS — M4317 Spondylolisthesis, lumbosacral region: Secondary | ICD-10-CM | POA: Diagnosis not present

## 2020-10-07 DIAGNOSIS — M5416 Radiculopathy, lumbar region: Secondary | ICD-10-CM | POA: Diagnosis not present

## 2020-11-22 DIAGNOSIS — M5416 Radiculopathy, lumbar region: Secondary | ICD-10-CM | POA: Diagnosis not present

## 2020-12-07 DIAGNOSIS — Z1211 Encounter for screening for malignant neoplasm of colon: Secondary | ICD-10-CM | POA: Diagnosis not present

## 2020-12-28 DIAGNOSIS — M5416 Radiculopathy, lumbar region: Secondary | ICD-10-CM | POA: Diagnosis not present

## 2020-12-28 DIAGNOSIS — M48061 Spinal stenosis, lumbar region without neurogenic claudication: Secondary | ICD-10-CM | POA: Diagnosis not present

## 2021-02-01 DIAGNOSIS — M5416 Radiculopathy, lumbar region: Secondary | ICD-10-CM | POA: Diagnosis not present

## 2021-02-22 DIAGNOSIS — M48061 Spinal stenosis, lumbar region without neurogenic claudication: Secondary | ICD-10-CM | POA: Diagnosis not present

## 2021-02-22 DIAGNOSIS — M5416 Radiculopathy, lumbar region: Secondary | ICD-10-CM | POA: Diagnosis not present

## 2021-03-06 DIAGNOSIS — N3000 Acute cystitis without hematuria: Secondary | ICD-10-CM | POA: Diagnosis not present

## 2021-04-07 DIAGNOSIS — M5416 Radiculopathy, lumbar region: Secondary | ICD-10-CM | POA: Diagnosis not present

## 2021-05-13 DIAGNOSIS — M48061 Spinal stenosis, lumbar region without neurogenic claudication: Secondary | ICD-10-CM | POA: Diagnosis not present

## 2021-05-13 DIAGNOSIS — I10 Essential (primary) hypertension: Secondary | ICD-10-CM | POA: Diagnosis not present

## 2021-05-13 DIAGNOSIS — Z6841 Body Mass Index (BMI) 40.0 and over, adult: Secondary | ICD-10-CM | POA: Diagnosis not present

## 2021-05-13 DIAGNOSIS — M5416 Radiculopathy, lumbar region: Secondary | ICD-10-CM | POA: Diagnosis not present

## 2021-06-07 DIAGNOSIS — M5416 Radiculopathy, lumbar region: Secondary | ICD-10-CM | POA: Diagnosis not present

## 2021-07-05 DIAGNOSIS — I10 Essential (primary) hypertension: Secondary | ICD-10-CM | POA: Diagnosis not present

## 2021-07-05 DIAGNOSIS — M5416 Radiculopathy, lumbar region: Secondary | ICD-10-CM | POA: Diagnosis not present

## 2021-07-06 ENCOUNTER — Other Ambulatory Visit: Payer: Self-pay | Admitting: Pain Medicine

## 2021-07-06 DIAGNOSIS — M5416 Radiculopathy, lumbar region: Secondary | ICD-10-CM

## 2021-07-30 ENCOUNTER — Other Ambulatory Visit: Payer: Self-pay

## 2021-07-30 ENCOUNTER — Ambulatory Visit
Admission: RE | Admit: 2021-07-30 | Discharge: 2021-07-30 | Disposition: A | Discharge status: Home / Self Care | Payer: BC Managed Care – PPO | Source: Ambulatory Visit | Attending: Pain Medicine | Admitting: Pain Medicine

## 2021-07-30 DIAGNOSIS — M48061 Spinal stenosis, lumbar region without neurogenic claudication: Secondary | ICD-10-CM | POA: Diagnosis not present

## 2021-07-30 DIAGNOSIS — M5416 Radiculopathy, lumbar region: Secondary | ICD-10-CM

## 2021-07-30 DIAGNOSIS — M545 Low back pain, unspecified: Secondary | ICD-10-CM | POA: Diagnosis not present

## 2021-08-02 DIAGNOSIS — M5416 Radiculopathy, lumbar region: Secondary | ICD-10-CM | POA: Diagnosis not present

## 2021-08-11 DIAGNOSIS — M5416 Radiculopathy, lumbar region: Secondary | ICD-10-CM | POA: Diagnosis not present

## 2021-09-28 DIAGNOSIS — M5416 Radiculopathy, lumbar region: Secondary | ICD-10-CM | POA: Diagnosis not present

## 2021-09-28 DIAGNOSIS — G894 Chronic pain syndrome: Secondary | ICD-10-CM | POA: Diagnosis not present

## 2021-11-02 DIAGNOSIS — E039 Hypothyroidism, unspecified: Secondary | ICD-10-CM | POA: Diagnosis not present

## 2021-11-02 DIAGNOSIS — Z23 Encounter for immunization: Secondary | ICD-10-CM | POA: Diagnosis not present

## 2021-11-02 DIAGNOSIS — Z1231 Encounter for screening mammogram for malignant neoplasm of breast: Secondary | ICD-10-CM | POA: Diagnosis not present

## 2021-11-02 DIAGNOSIS — I10 Essential (primary) hypertension: Secondary | ICD-10-CM | POA: Diagnosis not present

## 2021-11-04 ENCOUNTER — Other Ambulatory Visit: Payer: Self-pay | Admitting: Internal Medicine

## 2021-11-04 DIAGNOSIS — Z1231 Encounter for screening mammogram for malignant neoplasm of breast: Secondary | ICD-10-CM

## 2021-11-09 ENCOUNTER — Other Ambulatory Visit: Payer: Self-pay

## 2021-11-09 ENCOUNTER — Ambulatory Visit
Admission: RE | Admit: 2021-11-09 | Discharge: 2021-11-09 | Disposition: A | Discharge status: Home / Self Care | Payer: BC Managed Care – PPO | Source: Ambulatory Visit | Attending: Internal Medicine | Admitting: Internal Medicine

## 2021-11-09 DIAGNOSIS — Z1231 Encounter for screening mammogram for malignant neoplasm of breast: Secondary | ICD-10-CM

## 2021-11-22 DIAGNOSIS — M5416 Radiculopathy, lumbar region: Secondary | ICD-10-CM | POA: Diagnosis not present

## 2021-12-20 DIAGNOSIS — M48061 Spinal stenosis, lumbar region without neurogenic claudication: Secondary | ICD-10-CM | POA: Diagnosis not present

## 2021-12-20 DIAGNOSIS — M5416 Radiculopathy, lumbar region: Secondary | ICD-10-CM | POA: Diagnosis not present

## 2021-12-20 DIAGNOSIS — G894 Chronic pain syndrome: Secondary | ICD-10-CM | POA: Diagnosis not present

## 2022-02-04 IMAGING — MG MM DIGITAL SCREENING BILAT W/ TOMO AND CAD
6 of 12 series · 6 of 36 positions shown · non-contrast
Comparison: Previous exam(s).

ACR Breast Density Category a: The breast tissue is almost entirely
fatty.

CLINICAL DATA: Screening.

EXAM:
DIGITAL SCREENING BILATERAL MAMMOGRAM WITH TOMOSYNTHESIS AND CAD
TECHNIQUE: Bilateral screening digital craniocaudal and mediolateral oblique
mammograms were obtained. Bilateral screening digital breast
tomosynthesis was performed. The images were evaluated with
computer-aided detection.

[L MLO synth-2D (1 of 2)]
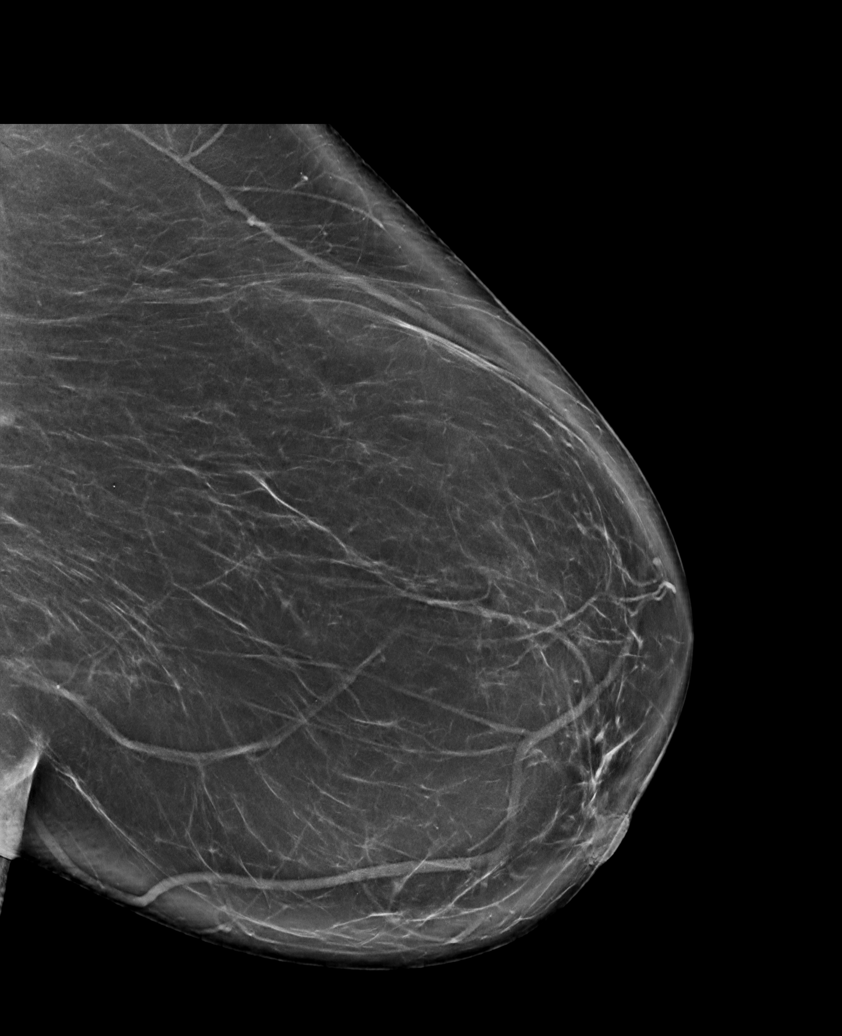

[L CC synth-2D]
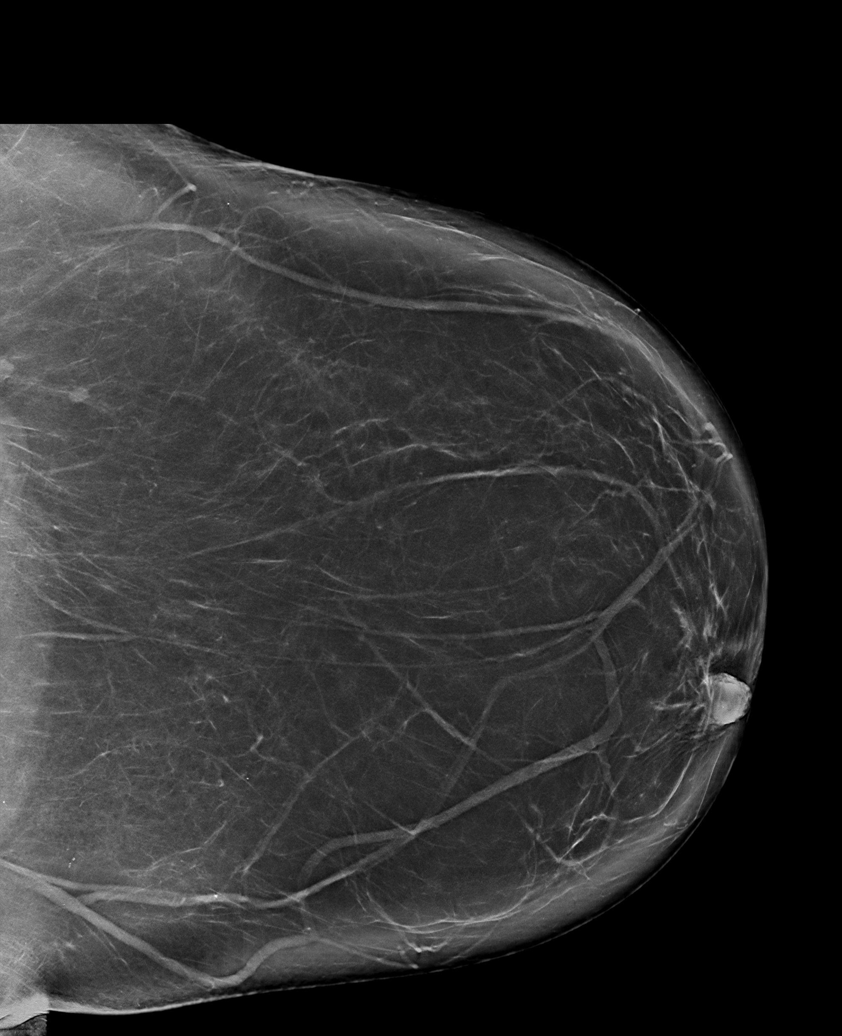

[L MLO synth-2D (2 of 2)]
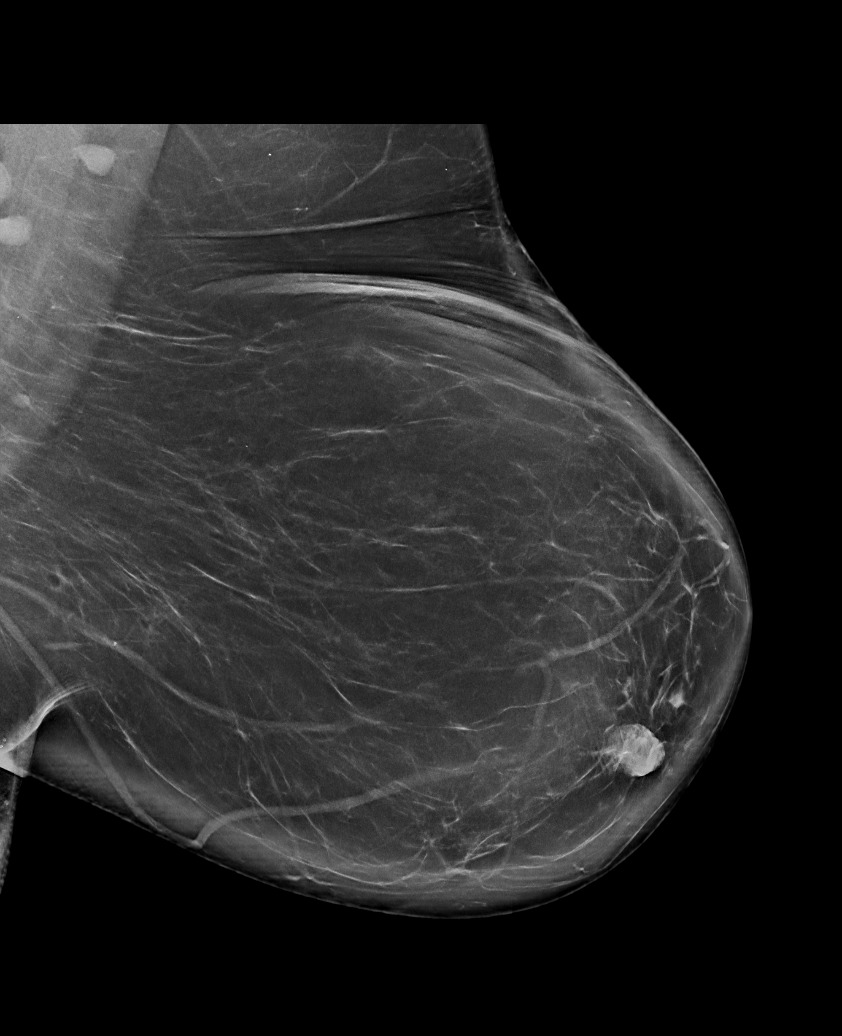

[R CC synth-2D]
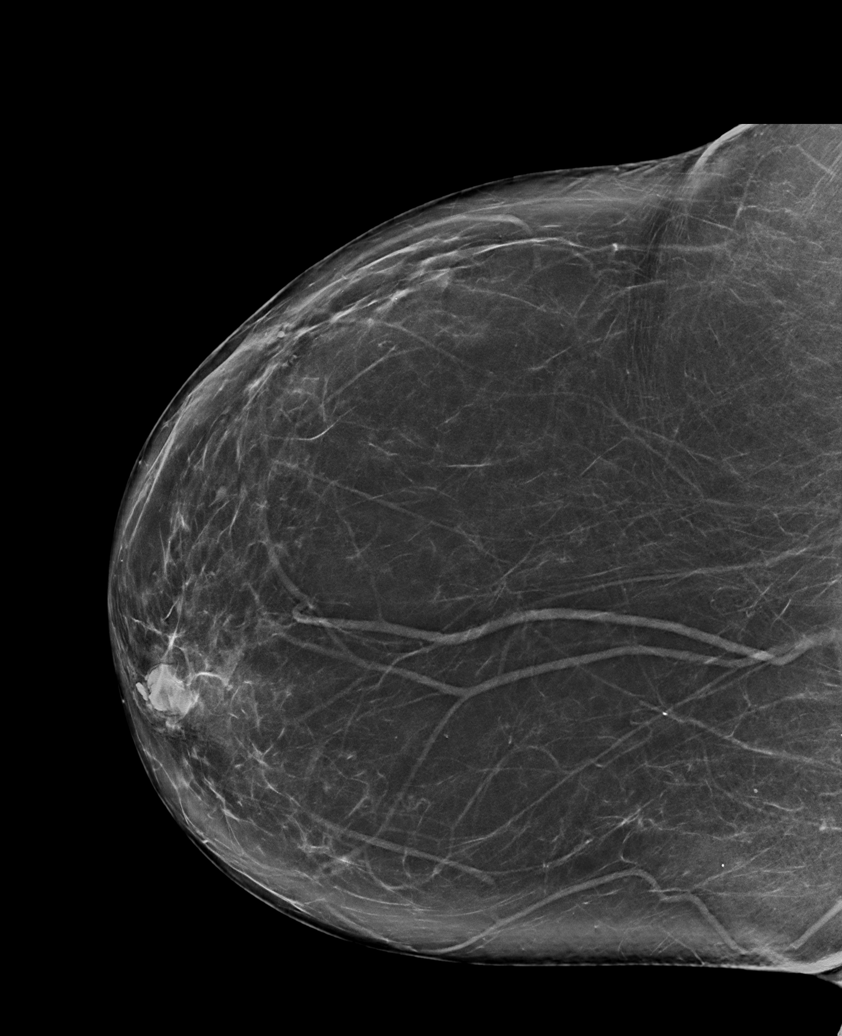

[R MLO synth-2D (1 of 2)]
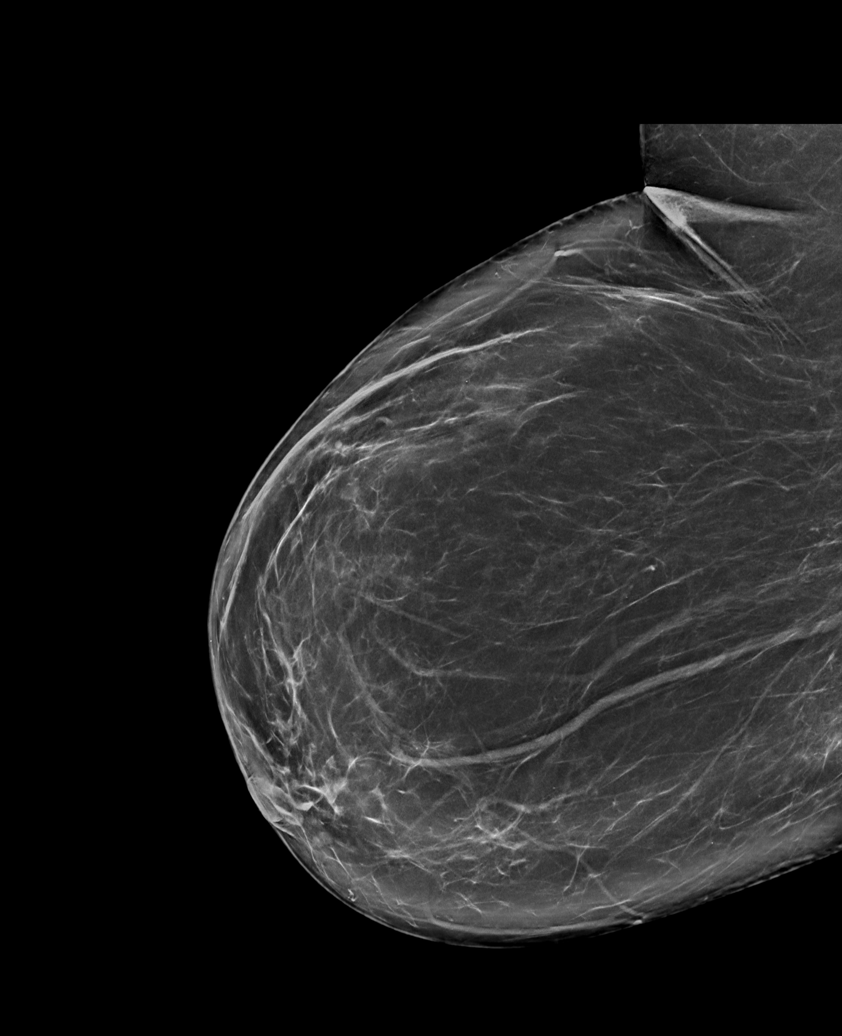

[R MLO synth-2D (2 of 2)]
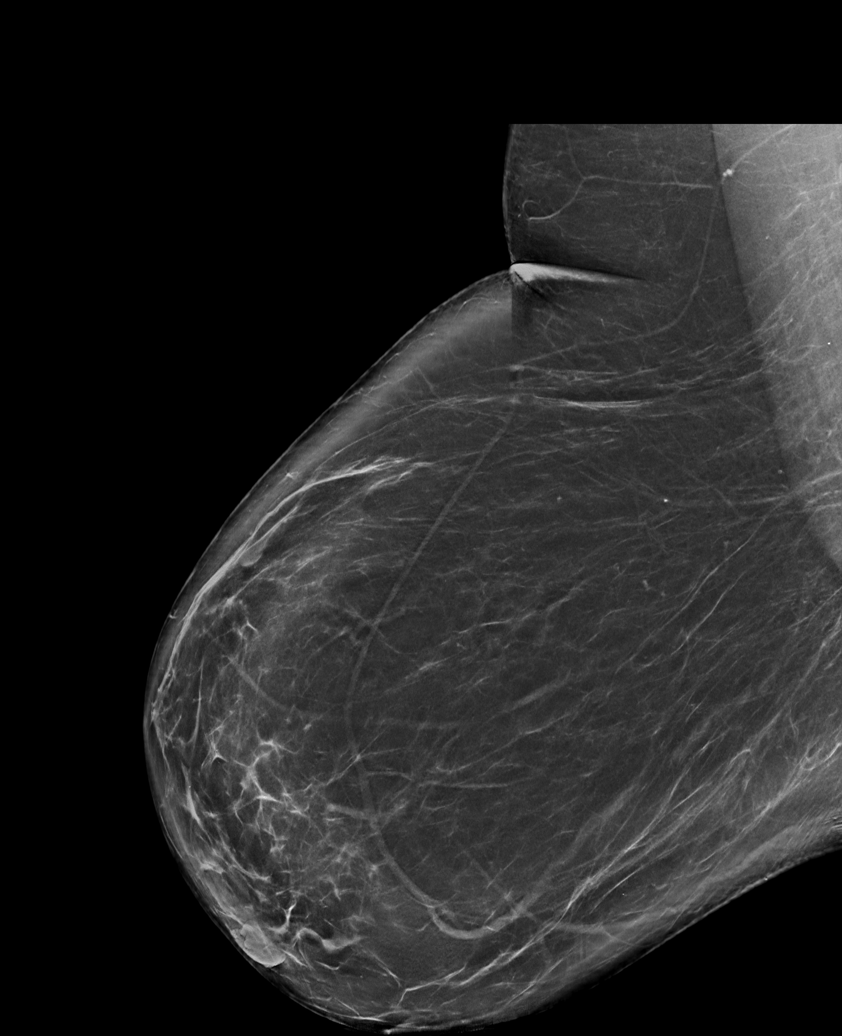

[6 of 36 positions shown; findings below may reference images not displayed]

FINDINGS: There are no findings suspicious for malignancy.
IMPRESSION: No mammographic evidence of malignancy. A result letter of this
screening mammogram will be mailed directly to the patient.

RECOMMENDATION:
Screening mammogram in one year. (Code:0E-3-N98)

BI-RADS CATEGORY  1: Negative.

## 2022-02-23 DIAGNOSIS — M5416 Radiculopathy, lumbar region: Secondary | ICD-10-CM | POA: Diagnosis not present

## 2022-03-08 DIAGNOSIS — Z23 Encounter for immunization: Secondary | ICD-10-CM | POA: Diagnosis not present

## 2022-03-08 DIAGNOSIS — I10 Essential (primary) hypertension: Secondary | ICD-10-CM | POA: Diagnosis not present

## 2022-03-08 DIAGNOSIS — Z13220 Encounter for screening for lipoid disorders: Secondary | ICD-10-CM | POA: Diagnosis not present

## 2022-03-08 DIAGNOSIS — G4733 Obstructive sleep apnea (adult) (pediatric): Secondary | ICD-10-CM | POA: Diagnosis not present

## 2022-03-08 DIAGNOSIS — Z0000 Encounter for general adult medical examination without abnormal findings: Secondary | ICD-10-CM | POA: Diagnosis not present

## 2022-03-08 DIAGNOSIS — E039 Hypothyroidism, unspecified: Secondary | ICD-10-CM | POA: Diagnosis not present

## 2022-03-28 DIAGNOSIS — Z1212 Encounter for screening for malignant neoplasm of rectum: Secondary | ICD-10-CM | POA: Diagnosis not present

## 2022-03-28 DIAGNOSIS — Z1211 Encounter for screening for malignant neoplasm of colon: Secondary | ICD-10-CM | POA: Diagnosis not present

## 2022-04-04 LAB — COLOGUARD: COLOGUARD: NEGATIVE

## 2022-04-14 DIAGNOSIS — I10 Essential (primary) hypertension: Secondary | ICD-10-CM | POA: Diagnosis not present

## 2022-04-14 DIAGNOSIS — M4716 Other spondylosis with myelopathy, lumbar region: Secondary | ICD-10-CM | POA: Diagnosis not present

## 2022-04-14 DIAGNOSIS — E785 Hyperlipidemia, unspecified: Secondary | ICD-10-CM | POA: Diagnosis not present

## 2022-05-04 DIAGNOSIS — M48061 Spinal stenosis, lumbar region without neurogenic claudication: Secondary | ICD-10-CM | POA: Diagnosis not present

## 2022-05-04 DIAGNOSIS — Z6841 Body Mass Index (BMI) 40.0 and over, adult: Secondary | ICD-10-CM | POA: Diagnosis not present

## 2022-05-04 DIAGNOSIS — R1032 Left lower quadrant pain: Secondary | ICD-10-CM | POA: Diagnosis not present

## 2022-05-04 DIAGNOSIS — M4317 Spondylolisthesis, lumbosacral region: Secondary | ICD-10-CM | POA: Diagnosis not present

## 2022-05-15 ENCOUNTER — Ambulatory Visit: Payer: BC Managed Care – PPO | Admitting: Orthopaedic Surgery

## 2022-05-15 ENCOUNTER — Encounter: Payer: Self-pay | Admitting: Orthopaedic Surgery

## 2022-05-15 DIAGNOSIS — M25552 Pain in left hip: Secondary | ICD-10-CM

## 2022-05-15 DIAGNOSIS — M1612 Unilateral primary osteoarthritis, left hip: Secondary | ICD-10-CM

## 2022-05-15 MED ORDER — TRAMADOL HCL 50 MG PO TABS: 100.0000 mg | ORAL_TABLET | Freq: Four times a day (QID) | ORAL | 0 refills | Status: DC | PRN
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
## 2022-05-15 NOTE — Addendum Note (Signed)
Addended by: Fredda Hammed L on: 05/15/2022 11:12 AM   Modules accepted: Orders

## 2022-05-18 ENCOUNTER — Encounter: Payer: Self-pay | Admitting: Orthopaedic Surgery

## 2022-05-25 ENCOUNTER — Encounter: Payer: Self-pay | Admitting: Physical Medicine & Rehabilitation

## 2022-05-25 ENCOUNTER — Encounter
Payer: BC Managed Care – PPO | Attending: Physical Medicine & Rehabilitation | Admitting: Physical Medicine & Rehabilitation

## 2022-05-25 VITALS — BP 132/85 | HR 64 | Ht 61.75 in | Wt 260.6 lb

## 2022-05-25 DIAGNOSIS — M1612 Unilateral primary osteoarthritis, left hip: Secondary | ICD-10-CM | POA: Diagnosis not present

## 2022-05-25 NOTE — Progress Notes (Signed)
Subjective:    Patient ID: Sherry Martinez, female    DOB: [birth year], 61 y.o.   MRN: [id number]  HPI CC:  Left gluteal pain   Radiates to Left knee, no numbness or tingling , some weakness.  Uses cane to ambulate Onset 1.5 yrs ago , saw a Neurosurgeon , MRI demonstrating L1-5 multilevel stenosis, patient tried epidural steroid injections at the neurosurgery office but these were not helpful.  Was referred to orthopedics.  Dr Magnus Ivan from ortho Dx  Left hip OA, needs THA but BMI too high Has tried PT not helpful Ice and Heat helpful  ASA and Ibuprofen not helpful Tramadol was helpful , cut down from 6 to 2 tabs , has brother with opioid addiction  Pain Inventory Average Pain 8 Pain Right Now 8 My pain is sharp, stabbing, and aching  In the last 24 hours, has pain interfered with the following? General activity 9 Relation with others 0 Enjoyment of life 10 What TIME of day is your pain at its worst? evening Sleep (in general) Fair  Pain is worse with: walking, bending, sitting, inactivity, and standing Pain improves with:  nothing Relief from Meds: 0  walk with assistance use a cane how many minutes can you walk? 5-10 ability to climb steps?  no do you drive?  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  trouble walking spasms  New pt  New pt    Family History  Problem Relation Age of Onset   CAD Father    Social History   Socioeconomic History   Marital status: Single    Spouse name: Not on file   Number of children: Not on file   Years of education: Not on file   Highest education level: Not on file  Occupational History   Not on file  Tobacco Use   Smoking status: Never   Smokeless tobacco: Not on file  Vaping Use   Vaping Use: Never used  Substance and Sexual Activity   Alcohol use: Yes    Comment: social   Drug use: No   Sexual activity: Not on file  Other Topics Concern   Not on file  Social History Narrative    Not on file   Social Determinants of Health   Financial Resource Strain: Not on file  Food Insecurity: Not on file  Transportation Needs: Not on file  Physical Activity: Not on file  Stress: Not on file  Social Connections: Not on file   Past Surgical History:  Procedure Laterality Date   CHOLECYSTECTOMY     Past Medical History:  Diagnosis Date   Hypertension    Thyroid disease    hypothyroid   BP 132/85   Pulse 64   Ht 5' 1.75" (1.568 m)   Wt 260 lb 9.6 oz (118.2 kg)   LMP 04/29/2014   SpO2 94%   BMI 48.05 kg/m   Opioid Risk Score:   Fall Risk Score:  `1  Depression screen [facility] 2/9     05/25/2022    9:55 AM  Depression screen PHQ 2/9  Decreased Interest 0  Down, Depressed, Hopeless 0  PHQ - 2 Score 0  Altered sleeping 1  Tired, decreased energy 1  Change in appetite 0  Feeling bad or failure about yourself  0  Trouble concentrating 0  Moving slowly or fidgety/restless 0  Suicidal thoughts 0  PHQ-9 Score 2  Difficult doing work/chores Not difficult at all  Review of Systems  Musculoskeletal:        Pain in left hip going down front of leg to knee  All other systems reviewed and are negative.     Objective:   Physical Exam Vitals and nursing note reviewed.  Constitutional:      Appearance: She is obese.  HENT:     Head: Normocephalic and atraumatic.  Eyes:     Extraocular Movements: Extraocular movements intact.     Conjunctiva/sclera: Conjunctivae normal.     Pupils: Pupils are equal, round, and reactive to light.  Neurological:     Mental Status: She is alert.  Psychiatric:        Mood and Affect: Mood normal.        Behavior: Behavior normal.    Right hip has mildly reduced internal rotation normal external rotation no pain with range of motion Left hip has severely reduced internal rotation accompanied by pain, mildly reduced external rotation accompanied by pain in the groin area Pain to palpation over the gluteal region Mild  Lumbar paraspinal tenderness Could not tolerate lying flat MMT Left 4+ HF, 4/5 Knee extension , 5/5 ankle DF/PF RIght 5/5 HF, KE, ADF   Did not tolerate SLR or thigh thrust, lying in supine position is painful, positive left hip flexion contracture      Assessment & Plan:    OA left hip severe, will need total hip arthroplasty per Ortho.  We will need to lose weight and according to patient has been referred to medical weight management.  In the meantime we will try to keep her comfortable with intra-articular hip joint injections.  We discussed her medication management as well.  She did not have any good success with nonsteroidal anti-inflammatories.  She has some relief from weak opioid, based on family history reluctant to try anything much stronger or increasing the dose.  We will continue with twice daily tramadol.  She states he already has some medications at home.

## 2022-05-26 ENCOUNTER — Encounter: Payer: Self-pay | Admitting: Orthopaedic Surgery

## 2022-05-30 DIAGNOSIS — M1612 Unilateral primary osteoarthritis, left hip: Secondary | ICD-10-CM | POA: Insufficient documentation

## 2022-05-30 DIAGNOSIS — M25552 Pain in left hip: Secondary | ICD-10-CM | POA: Insufficient documentation

## 2022-06-08 DIAGNOSIS — I10 Essential (primary) hypertension: Secondary | ICD-10-CM | POA: Diagnosis not present

## 2022-06-08 DIAGNOSIS — M1612 Unilateral primary osteoarthritis, left hip: Secondary | ICD-10-CM | POA: Diagnosis not present

## 2022-06-08 DIAGNOSIS — E039 Hypothyroidism, unspecified: Secondary | ICD-10-CM | POA: Diagnosis not present

## 2022-06-26 ENCOUNTER — Telehealth: Payer: Self-pay

## 2022-06-26 MED ORDER — TRAMADOL HCL 50 MG PO TABS: 50.0000 mg | ORAL_TABLET | Freq: Two times a day (BID) | ORAL | 5 refills | Status: DC

## 2022-06-26 NOTE — Telephone Encounter (Signed)
Patient calling in to get refill on Tramadol previous medication is running low and and advised Dr. Wynn Banker had discussed taking over medications at last visit.

## 2022-07-13 ENCOUNTER — Encounter: Payer: Self-pay | Admitting: Physical Medicine & Rehabilitation

## 2022-07-13 ENCOUNTER — Encounter
Payer: BC Managed Care – PPO | Attending: Physical Medicine & Rehabilitation | Admitting: Physical Medicine & Rehabilitation

## 2022-07-13 VITALS — BP 132/83 | HR 66 | Temp 98.2°F | Ht 61.75 in | Wt 261.0 lb

## 2022-07-13 DIAGNOSIS — M1612 Unilateral primary osteoarthritis, left hip: Secondary | ICD-10-CM | POA: Diagnosis not present

## 2022-07-13 DIAGNOSIS — G894 Chronic pain syndrome: Secondary | ICD-10-CM | POA: Insufficient documentation

## 2022-07-13 MED ORDER — LIDOCAINE HCL (PF) 1 % IJ SOLN
4.0000 mL | Freq: Once | INTRAMUSCULAR | Status: AC
  Administered 2022-07-13: 4 mL

## 2022-07-13 MED ORDER — BETAMETHASONE SOD PHOS & ACET 6 (3-3) MG/ML IJ SUSP
6.0000 mg | Freq: Once | INTRAMUSCULAR | Status: AC
  Administered 2022-07-13: 6 mg via INTRAMUSCULAR

## 2022-07-13 MED ORDER — LIDOCAINE HCL 1 % IJ SOLN
10.0000 mL | Freq: Once | INTRAMUSCULAR | Status: AC
  Administered 2022-07-13: 10 mL

## 2022-07-13 NOTE — Progress Notes (Signed)
Left hip intra-articular injection under fluoro guidance  Indication osteoarthritis unresponsive to conservative care including exercise and oral medications  Informed consent was obtained after describing risks and benefits of the procedure, this includes bleeding bruising and infection. The patient elected to proceed and has given written consent  Placed supine on fluoroscopy table.  Betadine prep to groin area.  Imaging to identify the intertrochanteric line at the base of the neck of the femur. 5 cc of 1% lidocaine were infiltrated into the skin and subcu tissue using 25-gauge 1.5 inch needle.  Then a 22-gauge 5" needle was inserted under fluoroscopic guidance targeting the junction of the femoral head and femoral neck area.  Bone contact was made.  Isovue 200 times a total of 3 cc were injected needle was adjusted to achieve intra-articular location. Then a solution containing 1 cc of 6 mg/cc Celestone and 4 cc of 1% lidocaine were injected.  Patient tolerated procedure well post procedure instructions given  

## 2022-07-13 NOTE — Patient Instructions (Signed)
Hip injection celestone and lidocaine

## 2022-07-13 NOTE — Progress Notes (Addendum)
  PROCEDURE RECORD [facility]   Name: Sherry Martinez DOB:December 11, 1960 MRN: [id number]  Date:07/13/2022  Physician: Claudette Laws, MD    Nurse/CMA: Nedra Hai, CMA  Allergies:  Allergies  Allergen Reactions   Augmentin [Amoxicillin-Pot Clavulanate] Hives   Codeine    Demerol [Meperidine] Hives   Lisinopril Other (See Comments)   Meperidine Hcl Other (See Comments)   Other Other (See Comments)    Consent Signed: Yes.    Is patient diabetic? No.  CBG today? .  Pregnant: No. LMP: Patient's last menstrual period was 04/29/2014. (age 59-55)  Anticoagulants: no Anti-inflammatory: no Antibiotics: no  Procedure: Intra Articular Hip Injection Left  Position: Supine Start Time: 10:48 am  End Time: 10:52 am  Fluoro Time: 20  RN/CMA Laquashia Mergenthaler. CMA Marquette Blodgett,CMA    Time 10:33 am 10:59 am    BP 132/83 160/94    Pulse 66 65    Respirations 16 16    O2 Sat 97 97    S/S 6 6    Pain Level 8/10 5/10     D/C home with no one, patient A & O X 3, D/C instructions reviewed, and sits independently.

## 2022-08-16 ENCOUNTER — Telehealth: Payer: Self-pay

## 2022-08-16 ENCOUNTER — Telehealth: Payer: Self-pay | Admitting: *Deleted

## 2022-08-16 ENCOUNTER — Encounter: Payer: Self-pay | Admitting: Orthopaedic Surgery

## 2022-08-16 NOTE — Telephone Encounter (Signed)
Can we tell what happened with the referral to the weight loss clinic? That was in July and she states she has yet to hear anything

## 2022-08-16 NOTE — Telephone Encounter (Signed)
Called and left VM with patient to discuss mychart message sent to Dr. Ninfa Linden regarding BMI and hip surgery. Will re-attempt if don't hear back from patient today.

## 2022-08-21 NOTE — Telephone Encounter (Signed)
Pt returned call. I have sent pt to [facility].

## 2022-08-23 ENCOUNTER — Telehealth: Payer: Self-pay | Admitting: *Deleted

## 2022-08-23 NOTE — Telephone Encounter (Signed)
Spoke with patient about acceptable BMI for having hip surgery. Luckily she is not a bundle, so doesn't have to be under 7, but I told her to safely do this it would have to significantly come down. I did check the referral and they are going to call her today to start her process with [facility]. Told her to call us back when she has made progress and ready for surgery.

## 2022-08-24 NOTE — Telephone Encounter (Signed)
Faxed order to [facility] to [phone]

## 2022-08-25 ENCOUNTER — Encounter: Payer: Self-pay | Admitting: Physical Medicine & Rehabilitation

## 2022-08-25 ENCOUNTER — Encounter
Payer: BC Managed Care – PPO | Attending: Physical Medicine & Rehabilitation | Admitting: Physical Medicine & Rehabilitation

## 2022-08-25 VITALS — BP 141/86 | HR 67 | Ht 60.75 in | Wt 265.0 lb

## 2022-08-25 DIAGNOSIS — Z5181 Encounter for therapeutic drug level monitoring: Secondary | ICD-10-CM | POA: Insufficient documentation

## 2022-08-25 DIAGNOSIS — G894 Chronic pain syndrome: Secondary | ICD-10-CM | POA: Insufficient documentation

## 2022-08-25 DIAGNOSIS — Z79891 Long term (current) use of opiate analgesic: Secondary | ICD-10-CM | POA: Insufficient documentation

## 2022-08-25 MED ORDER — CELECOXIB 100 MG PO CAPS: 100.0000 mg | ORAL_CAPSULE | Freq: Two times a day (BID) | ORAL | 1 refills | Status: DC

## 2022-08-25 NOTE — Progress Notes (Signed)
Subjective:    Patient ID: Sherry Martinez, female    DOB: [birth year], 61 y.o.   MRN: [id number]  HPI 61 year old female with morbid obesity and chronic left hip and knee pain.  She has left hip osteoarthritis but no significant osteoarthritis in the left knee.  Left knee pain 100% better  Left hip pain was around 50% better postinjection but that only lasted for a couple weeks. She is asking about other medications that she can use besides tramadol because this interacts with her naltrexone/bupropion weight loss medication.  She has an appointment with bariatric medicine this coming week No falls or other trauma.  Patient is on diclofenac 50 mg nightly Pain Inventory Average Pain 8 Pain Right Now 8 My pain is sharp, stabbing, and aching  In the last 24 hours, has pain interfered with the following? General activity 8 Relation with others 8 Enjoyment of life 10 What TIME of day is your pain at its worst? daytime and evening Sleep (in general) Poor  Pain is worse with: walking, bending, standing, and some activites Pain improves with: rest, heat/ice, medication, and injections Relief from Meds: 5  Family History  Problem Relation Age of Onset   CAD Father    Social History   Socioeconomic History   Marital status: Single    Spouse name: Not on file   Number of children: Not on file   Years of education: Not on file   Highest education level: Not on file  Occupational History   Not on file  Tobacco Use   Smoking status: Never   Smokeless tobacco: Not on file  Vaping Use   Vaping Use: Never used  Substance and Sexual Activity   Alcohol use: Yes    Comment: social   Drug use: No   Sexual activity: Not on file  Other Topics Concern   Not on file  Social History Narrative   Not on file   Social Determinants of Health   Financial Resource Strain: Not on file  Food Insecurity: Not on file  Transportation Needs: Not on file  Physical Activity: Not on file   Stress: Not on file  Social Connections: Not on file   Past Surgical History:  Procedure Laterality Date   CHOLECYSTECTOMY     Past Surgical History:  Procedure Laterality Date   CHOLECYSTECTOMY     Past Medical History:  Diagnosis Date   Hypertension    Thyroid disease    hypothyroid   BP (!) 141/86   Pulse 67   Ht 5' 0.75" (1.543 m)   Wt 265 lb (120.2 kg)   LMP 04/29/2014   SpO2 97%   BMI 50.48 kg/m   Opioid Risk Score:   Fall Risk Score:  `1  Depression screen [facility] 2/9     05/25/2022    9:55 AM  Depression screen PHQ 2/9  Decreased Interest 0  Down, Depressed, Hopeless 0  PHQ - 2 Score 0  Altered sleeping 1  Tired, decreased energy 1  Change in appetite 0  Feeling bad or failure about yourself  0  Trouble concentrating 0  Moving slowly or fidgety/restless 0  Suicidal thoughts 0  PHQ-9 Score 2  Difficult doing work/chores Not difficult at all     Review of Systems     Objective:   Physical Exam Vitals and nursing note reviewed.  Constitutional:      Appearance: She is obese.  HENT:     Head: Normocephalic and atraumatic.  Eyes:     Extraocular Movements: Extraocular movements intact.     Conjunctiva/sclera: Conjunctivae normal.     Pupils: Pupils are equal, round, and reactive to light.  Neurological:     Mental Status: She is alert.  Psychiatric:        Mood and Affect: Mood normal.        Behavior: Behavior normal.   Motor strength is 5/5 in Bilateral HF, KE and ankle DF Neg SLR bilateral  Full ROM bilateral knee, Reduced left hip IR/ER accompanied by pain        Assessment & Plan:   1.  Chronic left hip pain osteoarthritis, end-stage, would be appropriate for total hip arthroplasty other than her morbid obesity.  She needs to lose around 20 pounds. We discussed bariatric medications and is currently taking naltrexone.  Because of this would not use tramadol.  Other opiates would have similar ineffectiveness. Would recommend  nonsteroidal use on diclofenac 50 mg nightly we will DC this and start Celebrex 100 mg twice daily She does get good relief with the hip intra-articular injections she can continue this every 3 months as needed until she undergoes hip arthroplasty

## 2022-08-25 NOTE — Patient Instructions (Signed)
Stop diclofenac pills Start Celebrex 100mg  twice a day with food  Repeat Hip injection in 6wks

## 2022-08-29 ENCOUNTER — Telehealth: Payer: Self-pay | Admitting: Physical Medicine & Rehabilitation

## 2022-08-29 NOTE — Telephone Encounter (Signed)
Patient called having reaction to Celebrex, fever, cough, can't breathe.  Please call patient.

## 2022-08-30 ENCOUNTER — Encounter (INDEPENDENT_AMBULATORY_CARE_PROVIDER_SITE_OTHER): Payer: BC Managed Care – PPO | Admitting: Family Medicine

## 2022-08-30 DIAGNOSIS — Z0289 Encounter for other administrative examinations: Secondary | ICD-10-CM

## 2022-08-30 MED ORDER — MELOXICAM 7.5 MG PO TABS: 7.5000 mg | ORAL_TABLET | Freq: Every day | ORAL | 1 refills | Status: DC

## 2022-08-30 NOTE — Telephone Encounter (Signed)
I didn't see this yesterday- Jonni Sanger- can you call since they are your patient? Thanks- ML

## 2022-08-30 NOTE — Addendum Note (Signed)
Addended by: Charlett Blake on: 08/30/2022 11:09 AM   Modules accepted: Orders

## 2022-09-24 DIAGNOSIS — Z20822 Contact with and (suspected) exposure to covid-19: Secondary | ICD-10-CM | POA: Diagnosis not present

## 2022-09-24 DIAGNOSIS — H6692 Otitis media, unspecified, left ear: Secondary | ICD-10-CM | POA: Diagnosis not present

## 2022-09-24 DIAGNOSIS — J019 Acute sinusitis, unspecified: Secondary | ICD-10-CM | POA: Diagnosis not present

## 2022-09-24 DIAGNOSIS — E039 Hypothyroidism, unspecified: Secondary | ICD-10-CM | POA: Diagnosis not present

## 2022-09-26 ENCOUNTER — Ambulatory Visit (INDEPENDENT_AMBULATORY_CARE_PROVIDER_SITE_OTHER): Payer: BC Managed Care – PPO | Admitting: Family Medicine

## 2022-09-26 ENCOUNTER — Encounter (INDEPENDENT_AMBULATORY_CARE_PROVIDER_SITE_OTHER): Payer: Self-pay | Admitting: Family Medicine

## 2022-09-26 VITALS — BP 159/82 | HR 54 | Temp 98.1°F | Ht 61.0 in | Wt 254.0 lb

## 2022-09-26 DIAGNOSIS — Z1331 Encounter for screening for depression: Secondary | ICD-10-CM

## 2022-09-26 DIAGNOSIS — I10 Essential (primary) hypertension: Secondary | ICD-10-CM | POA: Diagnosis not present

## 2022-09-26 DIAGNOSIS — M1612 Unilateral primary osteoarthritis, left hip: Secondary | ICD-10-CM

## 2022-09-26 DIAGNOSIS — E039 Hypothyroidism, unspecified: Secondary | ICD-10-CM | POA: Diagnosis not present

## 2022-09-26 DIAGNOSIS — Z6841 Body Mass Index (BMI) 40.0 and over, adult: Secondary | ICD-10-CM

## 2022-09-26 DIAGNOSIS — R0602 Shortness of breath: Secondary | ICD-10-CM | POA: Diagnosis not present

## 2022-09-26 DIAGNOSIS — E7849 Other hyperlipidemia: Secondary | ICD-10-CM | POA: Diagnosis not present

## 2022-09-26 DIAGNOSIS — R5383 Other fatigue: Secondary | ICD-10-CM

## 2022-09-26 DIAGNOSIS — G4733 Obstructive sleep apnea (adult) (pediatric): Secondary | ICD-10-CM

## 2022-09-28 LAB — COMPREHENSIVE METABOLIC PANEL
ALT: 17 IU/L (ref 0–32)
AST: 20 IU/L (ref 0–40)
Albumin/Globulin Ratio: 1.4 (ref 1.2–2.2)
Albumin: 4.7 g/dL (ref 3.8–4.9)
Alkaline Phosphatase: 103 IU/L (ref 44–121)
BUN/Creatinine Ratio: 25 — ABNORMAL HIGH (ref 9–23)
BUN: 31 mg/dL — ABNORMAL HIGH (ref 6–24)
Bilirubin Total: 0.3 mg/dL (ref 0.0–1.2)
CO2: 23 mmol/L (ref 20–29)
Calcium: 10.2 mg/dL (ref 8.7–10.2)
Chloride: 101 mmol/L (ref 96–106)
Creatinine, Ser: 1.25 mg/dL — ABNORMAL HIGH (ref 0.57–1.00)
Globulin, Total: 3.3 g/dL (ref 1.5–4.5)
Glucose: 73 mg/dL (ref 70–99)
Potassium: 4.2 mmol/L (ref 3.5–5.2)
Sodium: 141 mmol/L (ref 134–144)
Total Protein: 8 g/dL (ref 6.0–8.5)
eGFR: 50 mL/min/{1.73_m2} — ABNORMAL LOW (ref >59)

## 2022-09-28 LAB — LIPID PANEL WITH LDL/HDL RATIO
Cholesterol, Total: 198 mg/dL (ref 100–199)
HDL: 76 mg/dL (ref >39)
LDL Chol Calc (NIH): 97 mg/dL (ref 0–99)
LDL/HDL Ratio: 1.3 ratio (ref 0.0–3.2)
Triglycerides: 144 mg/dL (ref 0–149)
VLDL Cholesterol Cal: 25 mg/dL (ref 5–40)

## 2022-09-28 LAB — CBC WITH DIFFERENTIAL/PLATELET
Basophils Absolute: 0.1 10*3/uL (ref 0.0–0.2)
Basos: 1 %
EOS (ABSOLUTE): 0.1 10*3/uL (ref 0.0–0.4)
Eos: 2 %
Hematocrit: 42.2 % (ref 34.0–46.6)
Hemoglobin: 14.2 g/dL (ref 11.1–15.9)
Immature Grans (Abs): 0 10*3/uL (ref 0.0–0.1)
Immature Granulocytes: 0 %
Lymphocytes Absolute: 2.6 10*3/uL (ref 0.7–3.1)
Lymphs: 39 %
MCH: 32.7 pg (ref 26.6–33.0)
MCHC: 33.6 g/dL (ref 31.5–35.7)
MCV: 97 fL (ref 79–97)
Monocytes Absolute: 0.3 10*3/uL (ref 0.1–0.9)
Monocytes: 5 %
Neutrophils Absolute: 3.6 10*3/uL (ref 1.4–7.0)
Neutrophils: 53 %
Platelets: 388 10*3/uL (ref 150–450)
RBC: 4.34 x10E6/uL (ref 3.77–5.28)
RDW: 12.9 % (ref 11.7–15.4)
WBC: 6.7 10*3/uL (ref 3.4–10.8)

## 2022-09-28 LAB — INSULIN, RANDOM: INSULIN: 19.2 u[IU]/mL (ref 2.6–24.9)

## 2022-09-28 LAB — T3: T3, Total: 79 ng/dL (ref 71–180)

## 2022-09-28 LAB — T4, FREE: Free T4: 1.8 ng/dL — ABNORMAL HIGH (ref 0.82–1.77)

## 2022-09-28 LAB — FOLATE: Folate: 9.3 ng/mL (ref >3.0)

## 2022-09-28 LAB — HEMOGLOBIN A1C
Est. average glucose Bld gHb Est-mCnc: 105 mg/dL
Hgb A1c MFr Bld: 5.3 % (ref 4.8–5.6)

## 2022-09-28 LAB — VITAMIN D 25 HYDROXY (VIT D DEFICIENCY, FRACTURES): Vit D, 25-Hydroxy: 10.5 ng/mL — ABNORMAL LOW (ref 30.0–100.0)

## 2022-09-28 LAB — VITAMIN B12: Vitamin B-12: 332 pg/mL (ref 232–1245)

## 2022-09-28 LAB — TSH: TSH: 0.539 u[IU]/mL (ref 0.450–4.500)

## 2022-10-05 NOTE — Progress Notes (Signed)
Chief Complaint:   OBESITY Sherry Martinez (MR# [id number]) is a 61 y.o. female who presents for evaluation and treatment of obesity and related comorbidities. Current BMI is Body mass index is 47.99 kg/m. Sherry Martinez has been struggling with her weight for many years and has been unsuccessful in either losing weight, maintaining weight loss, or reaching her healthy weight goal.  Sherry Martinez was referred by Dr. Magnus Ivan.  She is on Contrave for 1 month.  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  She works 6:30 - 5 PM and occasionally in the evenings.  Previously on    Phentermine (lost 100 pounds).  She eats out for breakfast and dinner daily.  She skips lunch daily, has a Diet Coke in the a.m.  [place] #12 combo with a cup of grits and a diet Pepsi.  Dinner is [employer] or [employer] salad.  Sherry Martinez is currently in the action stage of change and ready to dedicate time achieving and maintaining a healthier weight. Sherry Martinez is interested in becoming our patient and working on intensive lifestyle modifications including (but not limited to) diet and exercise for weight loss.  Sherry Martinez's habits were reviewed today and are as follows: her desired weight loss is 58 pounds, she has been heavy most of her life, she started gaining weight at age 32, her heaviest weight ever was 300 pounds, she has significant food cravings issues, she skips meals frequently, and she struggles with emotional eating.  Depression Screen Sherry Martinez's Food and Mood (modified PHQ-9) score was 3.     05/25/2022    9:55 AM  Depression screen PHQ 2/9  Decreased Interest 0  Down, Depressed, Hopeless 0  PHQ - 2 Score 0  Altered sleeping 1  Tired, decreased energy 1  Change in appetite 0  Feeling bad or failure about yourself  0  Trouble concentrating 0  Moving slowly or fidgety/restless 0  Suicidal thoughts 0  PHQ-9 Score 2  Difficult doing work/chores Not difficult at all   Subjective:   1. Other fatigue Sherry Martinez  denies daytime somnolence and denies waking up still tired. Patient has a history of symptoms of Epworth sleepiness scale. Sherry Martinez generally gets 5 or 6 hours of sleep per night, and states that she has generally restful sleep. Snoring is present. Apneic episodes are present. Epworth Sleepiness Score is 1.    2. SOBOE (shortness of breath on exertion) Sherry Martinez notes increasing shortness of breath with exercising and seems to be worsening over time with weight gain. She notes getting out of breath sooner with activity than she used to. This has not gotten worse recently. Sherry Martinez denies shortness of breath at rest or orthopnea.   3. Essential hypertension Blood pressure is elevated today. Denies chest pain, chest pressure and headache. Taking Triamterene-hctz and Metoprolol.  4. Other hyperlipidemia Currently taking atorvastatin diagnosed 1 year ago.  No side effects noted.  5. Hypothyroidism, unspecified type Diagnosed 20 years ago. Currently on 137 mcg daily. Denies any chest pain,shortness of breath or palpitations.  6. Unilateral primary osteoarthritis, left hip Needs a hip replacement.  Sees [facility].  7. OSA (obstructive sleep apnea) Has significant OSA but cannot tolerate CPAP.  Has not been seen by a sleep doctor in years.  Assessment/Plan:   1. Other fatigue We will obtain labs today. Sherry Martinez does feel that her weight is causing her energy to be lower than it should be. Fatigue may be related to obesity, depression or many other causes. Labs  will be ordered, and in the meanwhile, [name] will focus on self care including making healthy food choices, increasing physical activity and focusing on stress reduction.   - EKG 12-Lead - Vitamin B12 - Folate - Hemoglobin A1c - Insulin, random - VITAMIN D 25 Hydroxy (Vit-D Deficiency, Fractures)  2. SOBOE (shortness of breath on exertion) We will obtain labs today. Sherry Martinez does feel that she gets out of breath more easily that she used to when  she exercises. Sherry Martinez's shortness of breath appears to be obesity related and exercise induced. She has agreed to work on weight loss and gradually increase exercise to treat her exercise induced shortness of breath. Will continue to monitor closely.  - CBC with Differential/Platelet  3. Essential hypertension We will obtain labs today.  - Comprehensive metabolic panel - Lipid Panel With LDL/HDL Ratio  4. Other hyperlipidemia We will obtain labs today.  5. Hypothyroidism, unspecified type We will obtain labs today.  - TSH - T4, free - T3  6. Unilateral primary osteoarthritis, left hip Cat 3--follow up with [name] when BMI<45.  7. OSA (obstructive sleep apnea) Revisit referral to [name] when BMI at 40.  8. Depression screening Behavior modification techniques were discussed today to help Sherry Martinez deal with her emotional/non-hunger eating behaviors.  Orders and follow up as documented in patient record.    9. Class 3 severe obesity with serious comorbidity and body mass index (BMI) of 40.0 to 44.9 in adult, unspecified obesity type (HCC) Sherry Martinez is currently in the action stage of change and her goal is to continue with weight loss efforts. I recommend Sherry Martinez begin the structured treatment plan as follows:  She has agreed to keeping a food journal and adhering to recommended goals of 1300-1400 calories and 90 plus grams of protein daily.  Exercise goals: No exercise has been prescribed at this time.   Behavioral modification strategies: increasing lean protein intake, no skipping meals, meal planning and cooking strategies, keeping healthy foods in the home, planning for success, and keeping a strict food journal.  She was informed of the importance of frequent follow-up visits to maximize her success with intensive lifestyle modifications for her multiple health conditions. She was informed we would discuss her lab results at her next visit unless there is a critical issue that needs to  be addressed sooner. Sherry Martinez agreed to keep her next visit at the agreed upon time to discuss these results.  Objective:   Blood pressure (!) 159/82, pulse (!) 54, temperature 98.1 F (36.7 C), height 5\' 1"  (1.549 m), weight 254 lb (115.2 kg), last menstrual period 04/29/2014, SpO2 99 %. Body mass index is 47.99 kg/m.  EKG: Normal sinus rhythm, rate 53 bpm.  Indirect Calorimeter completed today shows a VO2 of 266 ml and a REE of 1829.  Her calculated basal metabolic rate is [id number] thus her basal metabolic rate is better than expected.  General: Cooperative, alert, well developed, in no acute distress. HEENT: Conjunctivae and lids unremarkable. Cardiovascular: Regular rhythm.  Lungs: Normal work of breathing. Neurologic: No focal deficits.   Lab Results  Component Value Date   CREATININE 1.25 (H) 09/26/2022   BUN 31 (H) 09/26/2022   NA 141 09/26/2022   K 4.2 09/26/2022   CL 101 09/26/2022   CO2 23 09/26/2022   Lab Results  Component Value Date   ALT 17 09/26/2022   AST 20 09/26/2022   ALKPHOS 103 09/26/2022   BILITOT 0.3 09/26/2022   Lab Results  Component Value Date  HGBA1C 5.3 09/26/2022   Lab Results  Component Value Date   INSULIN 19.2 09/26/2022   Lab Results  Component Value Date   TSH 0.539 09/26/2022   Lab Results  Component Value Date   CHOL 198 09/26/2022   HDL 76 09/26/2022   LDLCALC 97 09/26/2022   TRIG 144 09/26/2022   Lab Results  Component Value Date   WBC 6.7 09/26/2022   HGB 14.2 09/26/2022   HCT 42.2 09/26/2022   MCV 97 09/26/2022   PLT 388 09/26/2022   No results found for: "IRON", "TIBC", "FERRITIN"  Attestation Statements:   Reviewed by clinician on day of visit: allergies, medications, problem list, medical history, surgical history, family history, social history, and previous encounter notes.  I, Elnora Morrison, RMA am acting as transcriptionist for Coralie Common, MD.  This is the patient's first visit at [facility]. The patient's NEW PATIENT PACKET was reviewed at length. Included in the packet: current and past health history, medications, allergies, ROS, gynecologic history (women only), surgical history, family history, social history, weight history, weight loss surgery history (for those that have had weight loss surgery), nutritional evaluation, mood and food questionnaire, PHQ9, Epworth questionnaire, sleep habits questionnaire, patient life and health improvement goals questionnaire. These will all be scanned into the patient's chart under media.   During the visit, I independently reviewed the patient's EKG, bioimpedance scale results, and indirect calorimeter results. I used this information to tailor a meal plan for the patient that will help her to lose weight and will improve her obesity-related conditions going forward. I performed a medically necessary appropriate examination and/or evaluation. I discussed the assessment and treatment plan with the patient. The patient was provided an opportunity to ask questions and all were answered. The patient agreed with the plan and demonstrated an understanding of the instructions. Labs were ordered at this visit and will be reviewed at the next visit unless more critical results need to be addressed immediately. Clinical information was updated and documented in the EMR.   I have reviewed the above documentation for accuracy and completeness, and I agree with the above. - Coralie Common, MD

## 2022-10-10 ENCOUNTER — Ambulatory Visit (INDEPENDENT_AMBULATORY_CARE_PROVIDER_SITE_OTHER): Payer: BC Managed Care – PPO | Admitting: Family Medicine

## 2022-10-10 ENCOUNTER — Ambulatory Visit: Payer: BC Managed Care – PPO | Admitting: Physical Medicine & Rehabilitation

## 2022-10-16 ENCOUNTER — Ambulatory Visit (INDEPENDENT_AMBULATORY_CARE_PROVIDER_SITE_OTHER): Payer: BC Managed Care – PPO | Admitting: Family Medicine

## 2022-10-16 ENCOUNTER — Encounter (INDEPENDENT_AMBULATORY_CARE_PROVIDER_SITE_OTHER): Payer: Self-pay | Admitting: Family Medicine

## 2022-10-16 VITALS — BP 132/86 | HR 74 | Temp 98.4°F | Ht 61.0 in | Wt 255.0 lb

## 2022-10-16 DIAGNOSIS — E669 Obesity, unspecified: Secondary | ICD-10-CM | POA: Diagnosis not present

## 2022-10-16 DIAGNOSIS — R7989 Other specified abnormal findings of blood chemistry: Secondary | ICD-10-CM | POA: Diagnosis not present

## 2022-10-16 DIAGNOSIS — Z6841 Body Mass Index (BMI) 40.0 and over, adult: Secondary | ICD-10-CM

## 2022-10-16 DIAGNOSIS — E88819 Insulin resistance, unspecified: Secondary | ICD-10-CM

## 2022-10-16 DIAGNOSIS — E559 Vitamin D deficiency, unspecified: Secondary | ICD-10-CM | POA: Diagnosis not present

## 2022-10-16 MED ORDER — VITAMIN D (ERGOCALCIFEROL) 1.25 MG (50000 UNIT) PO CAPS: 50000.0000 [IU] | ORAL_CAPSULE | ORAL | 0 refills | Status: DC

## 2022-10-27 ENCOUNTER — Encounter: Payer: Self-pay | Admitting: Physical Medicine & Rehabilitation

## 2022-10-27 ENCOUNTER — Encounter
Payer: BC Managed Care – PPO | Attending: Physical Medicine & Rehabilitation | Admitting: Physical Medicine & Rehabilitation

## 2022-10-27 VITALS — BP 145/89 | HR 67 | Temp 98.0°F | Ht 61.0 in | Wt 260.0 lb

## 2022-10-27 DIAGNOSIS — M1612 Unilateral primary osteoarthritis, left hip: Secondary | ICD-10-CM | POA: Diagnosis not present

## 2022-10-27 MED ORDER — IOHEXOL 180 MG/ML  SOLN: 3.0000 mL | Freq: Once | INTRAMUSCULAR | Status: AC

## 2022-10-27 MED ORDER — LIDOCAINE HCL (PF) 1 % IJ SOLN: 5.0000 mL | Freq: Once | INTRAMUSCULAR | Status: AC

## 2022-10-27 MED ORDER — LIDOCAINE HCL 1 % IJ SOLN: 4.0000 mL | Freq: Once | INTRAMUSCULAR | Status: AC

## 2022-10-27 MED ORDER — BETAMETHASONE SOD PHOS & ACET 6 (3-3) MG/ML IJ SUSP: 12.0000 mg | Freq: Once | INTRAMUSCULAR | Status: AC

## 2022-10-27 NOTE — Progress Notes (Signed)
Left hip intra-articular injection under fluoro guidance  Indication osteoarthritis unresponsive to conservative care including exercise and oral medications  Informed consent was obtained after describing risks and benefits of the procedure, this includes bleeding bruising and infection. The patient elected to proceed and has given written consent  Placed supine on fluoroscopy table.  Betadine prep to groin area.  Imaging to identify the intertrochanteric line at the base of the neck of the femur. 5 cc of 1% lidocaine were infiltrated into the skin and subcu tissue using 25-gauge 1.5 inch needle.  Then a 22-gauge 5" needle was inserted under fluoroscopic guidance targeting the junction of the femoral head and femoral neck area.  Bone contact was made.  Isovue 200 times a total of 3 cc were injected needle was adjusted to achieve intra-articular location. Then a solution containing 1 cc of 6 mg/cc Celestone and 4 cc of 1% lidocaine were injected.  Patient tolerated procedure well post procedure instructions given

## 2022-10-27 NOTE — Patient Instructions (Signed)
Hip injection Left side with celestone and lidocaine

## 2022-10-27 NOTE — Progress Notes (Signed)
  PROCEDURE RECORD [facility]   Name: SHARONNA VINJE DOB:Jan 21, 1961 MRN: [id number]  Date:10/27/2022  Physician: Claudette Laws, MD    Nurse/CMA: Nicole Hafley RMA  Allergies:  Allergies  Allergen Reactions   Augmentin [Amoxicillin-Pot Clavulanate] Hives   Codeine    Demerol [Meperidine] Hives   Lisinopril Other (See Comments)   Meperidine Hcl Other (See Comments)   Other Other (See Comments)    Consent Signed: Yes.    Is patient diabetic? No.  CBG today? .  Pregnant: No. LMP: Patient's last menstrual period was 04/29/2014. (age 38-55)  Anticoagulants: no Anti-inflammatory: no Antibiotics: no  Procedure: Left Hip Injection Fluoro Guided   Position: Supine Start Time: 2:45pm  End Time: 2:51pm  Fluoro Time: 24  RN/CMA Elnoria Livingston RMA Rio Taber RMA    Time 2:05PM 2:55pm    BP 145/89 137/83    Pulse 67 67    Respirations 16 16    O2 Sat 98 98    S/S 6 6    Pain Level 8 0/10     D/C home with ., patient A & O X 3, D/C instructions reviewed, and sits independently.

## 2022-10-28 ENCOUNTER — Other Ambulatory Visit: Payer: Self-pay | Admitting: Physical Medicine & Rehabilitation

## 2022-10-29 NOTE — Progress Notes (Signed)
Chief Complaint:   OBESITY Sherry Martinez is here to discuss her progress with her obesity treatment plan along with follow-up of her obesity related diagnoses. Sherry Martinez is on keeping a food journal and adhering to recommended goals of 1300-1400 calories and 90 grams protein and states she is following her eating plan approximately ?% of the time. Sherry Martinez states she is not currently exercising.  Today's visit was #: 2 Starting weight: 254 lbs Starting date: 09/26/2022 Today's weight: 255 lbs Today's date: 10/16/2022 Total lbs lost to date: 0 Total lbs lost since last in-office visit: +1  Interim History: Pt felt the food wasn't bad but she knows she is short on protein. She is ending up with approximately 55-60 grams of daily. Pt denies hunger and actually tends to under-eat. She has a small bowl of Special K and berries for lunch. Pt can't stand for very long at the store, so she needs readily available food options. Getting steroid shot December 29th.   Subjective:   1. Elevated serum creatinine Sherry Martinez had an elevated creatinine with no prior elevated creatinine. We can't see labs from PCP.   2. Vitamin D deficiency Sherry Martinez denies nausea, vomiting, and muscle weakness but notes fatigue. She is on prescription Vit D. She had a Vit D level of 10.5.  3. Insulin resistance Sherry Martinez's last A1c was 5.3 with an insulin level of 19.2. She is not on medication.  Assessment/Plan:   1. Elevated serum creatinine Repeat BMP at next appt. Pt encouraged to substitute. Increase water intake.  2. Vitamin D deficiency Low Vitamin D level contributes to fatigue and are associated with obesity, breast, and colon cancer. She agrees to continue to take prescription Vitamin D 50,000 IU every week and will follow-up for routine testing of Vitamin D, at least 2-3 times per year to avoid over-replacement.  Refill- Vitamin D, Ergocalciferol, (DRISDOL) 1.25 MG (50000 UNIT) CAPS capsule; Take 1 capsule (50,000  Units total) by mouth every 7 (seven) days.  Dispense: 4 capsule; Refill: 0  3. Insulin resistance Sherry Martinez will continue to work on weight loss, exercise, and decreasing simple carbohydrates to help decrease the risk of diabetes. Sherry Martinez agreed to follow-up with [place] as directed to closely monitor her progress. Pathophysiology of insulin resistance and diabetes discussed. Continue journaling and repeat labs in 3 months.  4. Obesity with current BMI of 48.2 Sherry Martinez is currently in the action stage of change. As such, her goal is to continue with weight loss efforts. She has agreed to keeping a food journal and adhering to recommended goals of 1300-1400 calories and 90+ grams protein.   Exercise goals: No exercise has been prescribed at this time.  Behavioral modification strategies: increasing lean protein intake, meal planning and cooking strategies, planning for success, and keeping a strict food journal.  Katiya has agreed to follow-up with our clinic in 2 weeks. She was informed of the importance of frequent follow-up visits to maximize her success with intensive lifestyle modifications for her multiple health conditions.   Objective:   Blood pressure 132/86, pulse 74, temperature 98.4 F (36.9 C), height 5\' 1"  (1.549 m), weight 255 lb (115.7 kg), last menstrual period 04/29/2014, SpO2 98 %. Body mass index is 48.18 kg/m.  General: Cooperative, alert, well developed, in no acute distress. HEENT: Conjunctivae and lids unremarkable. Cardiovascular: Regular rhythm.  Lungs: Normal work of breathing. Neurologic: No focal deficits.   Lab Results  Component Value Date   CREATININE 1.25 (H) 09/26/2022   BUN 31 (  H) 09/26/2022   NA 141 09/26/2022   K 4.2 09/26/2022   CL 101 09/26/2022   CO2 23 09/26/2022   Lab Results  Component Value Date   ALT 17 09/26/2022   AST 20 09/26/2022   ALKPHOS 103 09/26/2022   BILITOT 0.3 09/26/2022   Lab Results  Component Value Date   HGBA1C 5.3  09/26/2022   Lab Results  Component Value Date   INSULIN 19.2 09/26/2022   Lab Results  Component Value Date   TSH 0.539 09/26/2022   Lab Results  Component Value Date   CHOL 198 09/26/2022   HDL 76 09/26/2022   LDLCALC 97 09/26/2022   TRIG 144 09/26/2022   Lab Results  Component Value Date   VD25OH 10.5 (L) 09/26/2022   Lab Results  Component Value Date   WBC 6.7 09/26/2022   HGB 14.2 09/26/2022   HCT 42.2 09/26/2022   MCV 97 09/26/2022   PLT 388 09/26/2022   Attestation Statements:   Reviewed by clinician on day of visit: allergies, medications, problem list, medical history, surgical history, family history, social history, and previous encounter notes.  Time spent on visit including pre-visit chart review and post-visit care and charting was 45 minutes.   I, Kyung Rudd, BS, CMA, am acting as transcriptionist for Reuben Likes, MD.      I have reviewed the above documentation for accuracy and completeness, and I agree with the above. - Reuben Likes, MD

## 2022-11-01 ENCOUNTER — Encounter (INDEPENDENT_AMBULATORY_CARE_PROVIDER_SITE_OTHER): Payer: Self-pay | Admitting: Family Medicine

## 2022-11-01 ENCOUNTER — Encounter (INDEPENDENT_AMBULATORY_CARE_PROVIDER_SITE_OTHER): Payer: Self-pay

## 2022-11-01 ENCOUNTER — Ambulatory Visit (INDEPENDENT_AMBULATORY_CARE_PROVIDER_SITE_OTHER): Payer: BC Managed Care – PPO | Admitting: Family Medicine

## 2022-11-01 VITALS — BP 170/94 | HR 61 | Temp 98.1°F | Ht 61.0 in | Wt 255.0 lb

## 2022-11-01 DIAGNOSIS — Z6841 Body Mass Index (BMI) 40.0 and over, adult: Secondary | ICD-10-CM

## 2022-11-01 DIAGNOSIS — I10 Essential (primary) hypertension: Secondary | ICD-10-CM

## 2022-11-01 DIAGNOSIS — E669 Obesity, unspecified: Secondary | ICD-10-CM | POA: Diagnosis not present

## 2022-11-01 DIAGNOSIS — E559 Vitamin D deficiency, unspecified: Secondary | ICD-10-CM | POA: Diagnosis not present

## 2022-11-01 MED ORDER — VITAMIN D (ERGOCALCIFEROL) 1.25 MG (50000 UNIT) PO CAPS: 50000.0000 [IU] | ORAL_CAPSULE | ORAL | 0 refills | Status: DC

## 2022-11-01 MED ORDER — TRIAMTERENE-HCTZ 75-50 MG PO TABS: 1.0000 | ORAL_TABLET | Freq: Every day | ORAL | 3 refills | Status: DC

## 2022-11-01 MED ORDER — WEGOVY 0.25 MG/0.5ML ~~LOC~~ SOAJ: 0.2500 mg | SUBCUTANEOUS | 0 refills | Status: DC

## 2022-11-01 NOTE — Telephone Encounter (Signed)
Working on PA

## 2022-11-12 NOTE — Progress Notes (Signed)
Chief Complaint:   OBESITY Sherry Martinez is here to discuss her progress with her obesity treatment plan along with follow-up of her obesity related diagnoses. Sherry Martinez is on keeping a food journal and adhering to recommended goals of 1400 calories and 90 grams of protein and states she is following her eating plan approximately 100% of the time. Sherry Martinez states she is exercising 0 minutes 0 times per week.  Today's visit was #: 3 Starting weight: 254 lbs Starting date: 09/26/2022 Today's weight: 255 lbs Today's date: 11/01/2022 Total lbs lost to date: 0 lbs Total lbs lost since last in-office visit: 0  Interim History: Sherry Martinez has logged all food.  Average calories are 1200-1300 per journal.  Average protein at 70 grams/day.  She is  in significant pain and desperately needs surgery for left hip.  Feels frustrated.  Nothing coming up in next few weeks except work.  Subjective:   1. Essential hypertension Blood pressure elevated again today. Denies chest pain, chest pressure and headache.  2. Vitamin D deficiency Sherry Martinez is currently taking prescription Vit D 50,000 IU once a week.  Denies any nausea, vomiting or muscle weakness.  She notes fatigue.  Assessment/Plan:   1. Essential hypertension Refill/Increase Maxzide to 75-50 mg daily for 1 month with 0 refills.  -Refill/Increase triamterene-hydrochlorothiazide (MAXZIDE) 75-50 MG tablet; Take 1 tablet by mouth daily.  Dispense: 90 tablet; Refill: 3  2. Vitamin D deficiency We will refill Vit D 50k IU once weekly for 1 month with 0 refills.  -Refill Vitamin D, Ergocalciferol, (DRISDOL) 1.25 MG (50000 UNIT) CAPS capsule; Take 1 capsule (50,000 Units total) by mouth every 7 (seven) days.  Dispense: 4 capsule; Refill: 0  3. Obesity with current BMI of 48.2 Start Wegovy 0.25 mg SubQ once weekly for 1 month with 0 refills.  -Start Semaglutide-Weight Management (WEGOVY) 0.25 MG/0.5ML SOAJ; Inject 0.25 mg into the skin once a week.   Dispense: 2 mL; Refill: 0  Sherry Martinez is currently in the action stage of change. As such, her goal is to continue with weight loss efforts. She has agreed to keeping a food journal and adhering to recommended goals of 1400 calories and 90 grams of protein daily.    Sherry Martinez is to increase calories then increase protein to goal.  Exercise goals: No exercise has been prescribed at this time.   Behavioral modification strategies: increasing lean protein intake, meal planning and cooking strategies, keeping healthy foods in the home, planning for success, and keeping a strict food journal.  Sherry Martinez has agreed to follow-up with our clinic in 3 weeks. She was informed of the importance of frequent follow-up visits to maximize her success with intensive lifestyle modifications for her multiple health conditions.   Objective:   Blood pressure (!) 170/94, pulse 61, temperature 98.1 F (36.7 C), height 5\' 1"  (1.549 m), weight 255 lb (115.7 kg), last menstrual period 04/29/2014, SpO2 99 %. Body mass index is 48.18 kg/m.  General: Cooperative, alert, well developed, in no acute distress. HEENT: Conjunctivae and lids unremarkable. Cardiovascular: Regular rhythm.  Lungs: Normal work of breathing. Neurologic: No focal deficits.   Lab Results  Component Value Date   CREATININE 1.25 (H) 09/26/2022   BUN 31 (H) 09/26/2022   NA 141 09/26/2022   K 4.2 09/26/2022   CL 101 09/26/2022   CO2 23 09/26/2022   Lab Results  Component Value Date   ALT 17 09/26/2022   AST 20 09/26/2022   ALKPHOS 103 09/26/2022   BILITOT  0.3 09/26/2022   Lab Results  Component Value Date   HGBA1C 5.3 09/26/2022   Lab Results  Component Value Date   INSULIN 19.2 09/26/2022   Lab Results  Component Value Date   TSH 0.539 09/26/2022   Lab Results  Component Value Date   CHOL 198 09/26/2022   HDL 76 09/26/2022   LDLCALC 97 09/26/2022   TRIG 144 09/26/2022   Lab Results  Component Value Date   VD25OH 10.5 (L)  09/26/2022   Lab Results  Component Value Date   WBC 6.7 09/26/2022   HGB 14.2 09/26/2022   HCT 42.2 09/26/2022   MCV 97 09/26/2022   PLT 388 09/26/2022   No results found for: "IRON", "TIBC", "FERRITIN"  Attestation Statements:   Reviewed by clinician on day of visit: allergies, medications, problem list, medical history, surgical history, family history, social history, and previous encounter notes.  I, Elnora Morrison, RMA am acting as transcriptionist for Coralie Common, MD.  I have reviewed the above documentation for accuracy and completeness, and I agree with the above. - Coralie Common, MD

## 2022-11-17 ENCOUNTER — Encounter (INDEPENDENT_AMBULATORY_CARE_PROVIDER_SITE_OTHER): Payer: Self-pay | Admitting: Family Medicine

## 2022-11-20 NOTE — Telephone Encounter (Signed)
Please advise 

## 2022-11-27 ENCOUNTER — Ambulatory Visit (INDEPENDENT_AMBULATORY_CARE_PROVIDER_SITE_OTHER): Payer: BC Managed Care – PPO | Admitting: Family Medicine

## 2022-11-27 ENCOUNTER — Encounter (INDEPENDENT_AMBULATORY_CARE_PROVIDER_SITE_OTHER): Payer: Self-pay | Admitting: Family Medicine

## 2022-11-27 VITALS — BP 127/86 | HR 73 | Temp 98.7°F | Ht 61.0 in | Wt 253.0 lb

## 2022-11-27 DIAGNOSIS — I10 Essential (primary) hypertension: Secondary | ICD-10-CM | POA: Diagnosis not present

## 2022-11-27 DIAGNOSIS — E559 Vitamin D deficiency, unspecified: Secondary | ICD-10-CM

## 2022-11-27 DIAGNOSIS — Z6841 Body Mass Index (BMI) 40.0 and over, adult: Secondary | ICD-10-CM | POA: Diagnosis not present

## 2022-11-27 DIAGNOSIS — E669 Obesity, unspecified: Secondary | ICD-10-CM | POA: Diagnosis not present

## 2022-11-27 MED ORDER — WEGOVY 0.25 MG/0.5ML ~~LOC~~ SOAJ: 0.2500 mg | SUBCUTANEOUS | 0 refills | Status: DC

## 2022-11-27 MED ORDER — VITAMIN D (ERGOCALCIFEROL) 1.25 MG (50000 UNIT) PO CAPS: 50000.0000 [IU] | ORAL_CAPSULE | ORAL | 0 refills | Status: DC

## 2022-11-29 ENCOUNTER — Encounter (INDEPENDENT_AMBULATORY_CARE_PROVIDER_SITE_OTHER): Payer: Self-pay | Admitting: Family Medicine

## 2022-11-29 ENCOUNTER — Other Ambulatory Visit (INDEPENDENT_AMBULATORY_CARE_PROVIDER_SITE_OTHER): Payer: Self-pay

## 2022-11-29 MED ORDER — WEGOVY 0.25 MG/0.5ML ~~LOC~~ SOAJ: 0.2500 mg | SUBCUTANEOUS | 0 refills | Status: DC

## 2022-12-06 NOTE — Progress Notes (Signed)
Chief Complaint:   OBESITY Sherry Martinez is here to discuss her progress with her obesity treatment plan along with follow-up of her obesity related diagnoses. Sherry Martinez is on keeping a food journal and adhering to recommended goals of 1400 calories and 90 grams of protein and states she is following her eating plan approximately 100% of the time. Sherry Martinez states she is exercising 0 minutes 0 times per week.  Today's visit was #: 4 Starting weight: 254 lbs Starting date: 09/26/2022 Today's weight: 253 lbs Today's date: 11/27/2022 Total lbs lost to date: 1 lb Total lbs lost since last in-office visit: 2  Interim History: Sherry Martinez's been logging recently and brought log in today.  Average calories per day is 1462.  Average weekly protein/daily of 87 g.  Some days she is calorie light.  For the next few weeks she only has work.  Subjective:   1. Vitamin D deficiency Sherry Martinez is currently taking prescription Vit D 50,000 IU once a week.   Denies any nausea, vomiting or muscle weakness.  She notes fatigue.  2. Essential hypertension Blood pressure much better controlled today on a half a dose.  Previous blood pressure medication.  Denies chest pain, chest pressure and headache.  Assessment/Plan:   1. Vitamin D deficiency We will refill Vit D 50K IU once a week for 1 month with 0 refills.  -RefillVitamin D, Ergocalciferol, (DRISDOL) 1.25 MG (50000 UNIT) CAPS capsule; Take 1 capsule (50,000 Units total) by mouth every 7 (seven) days.  Dispense: 4 capsule; Refill: 0  2. Essential hypertension Continue cutting pill of 75-50 and half.  3. Obesity with current BMI of 47.8 Sherry Martinez is currently in the action stage of change. As such, her goal is to continue with weight loss efforts. She has agreed to keeping a food journal and adhering to recommended goals of 1450-1600 calories and 90+ grams of protein daily.   Exercise goals: No exercise has been prescribed at this time.  Behavioral modification  strategies: increasing lean protein intake, meal planning and cooking strategies, keeping healthy foods in the home, and planning for success.  Sherry Martinez has agreed to follow-up with our clinic in 3 weeks. She was informed of the importance of frequent follow-up visits to maximize her success with intensive lifestyle modifications for her multiple health conditions.   Objective:   Blood pressure 127/86, pulse 73, temperature 98.7 F (37.1 C), height 5' 1"$  (1.549 m), weight 253 lb (114.8 kg), last menstrual period 04/29/2014, SpO2 97 %. Body mass index is 47.8 kg/m.  General: Cooperative, alert, well developed, in no acute distress. HEENT: Conjunctivae and lids unremarkable. Cardiovascular: Regular rhythm.  Lungs: Normal work of breathing. Neurologic: No focal deficits.   Lab Results  Component Value Date   CREATININE 1.25 (H) 09/26/2022   BUN 31 (H) 09/26/2022   NA 141 09/26/2022   K 4.2 09/26/2022   CL 101 09/26/2022   CO2 23 09/26/2022   Lab Results  Component Value Date   ALT 17 09/26/2022   AST 20 09/26/2022   ALKPHOS 103 09/26/2022   BILITOT 0.3 09/26/2022   Lab Results  Component Value Date   HGBA1C 5.3 09/26/2022   Lab Results  Component Value Date   INSULIN 19.2 09/26/2022   Lab Results  Component Value Date   TSH 0.539 09/26/2022   Lab Results  Component Value Date   CHOL 198 09/26/2022   HDL 76 09/26/2022   LDLCALC 97 09/26/2022   TRIG 144 09/26/2022   Lab  Results  Component Value Date   VD25OH 10.5 (L) 09/26/2022   Lab Results  Component Value Date   WBC 6.7 09/26/2022   HGB 14.2 09/26/2022   HCT 42.2 09/26/2022   MCV 97 09/26/2022   PLT 388 09/26/2022   No results found for: "IRON", "TIBC", "FERRITIN"  Attestation Statements:   Reviewed by clinician on day of visit: allergies, medications, problem list, medical history, surgical history, family history, social history, and previous encounter notes.  I, Sherry Martinez, RMA am acting as  transcriptionist for Coralie Common, MD.  I have reviewed the above documentation for accuracy and completeness, and I agree with the above. - Coralie Common, MD

## 2022-12-21 ENCOUNTER — Ambulatory Visit (INDEPENDENT_AMBULATORY_CARE_PROVIDER_SITE_OTHER): Payer: BC Managed Care – PPO | Admitting: Family Medicine

## 2022-12-21 ENCOUNTER — Encounter (INDEPENDENT_AMBULATORY_CARE_PROVIDER_SITE_OTHER): Payer: Self-pay | Admitting: Family Medicine

## 2022-12-21 VITALS — BP 118/82 | HR 70 | Temp 97.7°F | Ht 61.0 in | Wt 253.0 lb

## 2022-12-21 DIAGNOSIS — I10 Essential (primary) hypertension: Secondary | ICD-10-CM

## 2022-12-21 DIAGNOSIS — E559 Vitamin D deficiency, unspecified: Secondary | ICD-10-CM

## 2022-12-21 DIAGNOSIS — E669 Obesity, unspecified: Secondary | ICD-10-CM

## 2022-12-21 DIAGNOSIS — Z6841 Body Mass Index (BMI) 40.0 and over, adult: Secondary | ICD-10-CM

## 2022-12-21 MED ORDER — VITAMIN D (ERGOCALCIFEROL) 1.25 MG (50000 UNIT) PO CAPS: 50000.0000 [IU] | ORAL_CAPSULE | ORAL | 0 refills | Status: DC

## 2022-12-21 MED ORDER — WEGOVY 0.5 MG/0.5ML ~~LOC~~ SOAJ: 0.5000 mg | SUBCUTANEOUS | 0 refills | Status: DC

## 2022-12-21 NOTE — Progress Notes (Deleted)
Patient has been mostly working in the last few weeks.  Protein is almost up to 100g daily (weekly averages of 91g, 99, 102).  Calorie averages of 1691, 1552, 1401.  Denies any upcoming events or activities.  Sugar cravings have been increasing.  Upon review of food journal she is occasionally slightly under calories and more consistently around 1700 daily.

## 2022-12-24 ENCOUNTER — Other Ambulatory Visit: Payer: Self-pay | Admitting: Physical Medicine & Rehabilitation

## 2022-12-27 ENCOUNTER — Encounter (INDEPENDENT_AMBULATORY_CARE_PROVIDER_SITE_OTHER): Payer: Self-pay | Admitting: Family Medicine

## 2022-12-28 NOTE — Progress Notes (Signed)
Chief Complaint:   OBESITY Sherry Martinez is here to discuss her progress with her obesity treatment plan along with follow-up of her obesity related diagnoses. Sherry Martinez is on keeping a food journal and adhering to recommended goals of 1450-1600 calories and 90+ grams of protein and states she is following her eating plan approximately 100% of the time. Sherry Martinez states she is exercising 0 minutes 0 times per week.  Today's visit was #: 5 Starting weight: 254 lbs Starting date: 09/26/2022 Today's weight: 253 lbs Today's date: 12/21/2022 Total lbs lost to date: 1 lb Total lbs lost since last in-office visit: 0  Interim History: Sherry Martinez has been mostly working in the last few weeks.  Protein is almost up to 100g daily (weekly averages of 91g, 99, 102).  Calorie averages of 1691, 1552, 1401.  Denies any upcoming events or activities.  Sugar cravings have been increasing.  Upon review of food journal she is occasionally slightly under calories and more consistently around 1700 daily.    Subjective:   1. Essential hypertension Blood pressure well controlled today.  Denies chest pain, chest pressure and headache.  On Maxzide, Toprol and HCTZ.  2. Vitamin D deficiency Sherry Martinez is currently taking prescription Vit D 50,000 IU once a week.  Denies any nausea, vomiting or muscle weakness.  Assessment/Plan:   1. Essential hypertension Continue current medications without changes in dose.  2. Vitamin D deficiency We will refill Vit D 50K IU once a week for 1 month with 0 refills.  -Refill Vitamin D, Ergocalciferol, (DRISDOL) 1.25 MG (50000 UNIT) CAPS capsule; Take 1 capsule (50,000 Units total) by mouth every 7 (seven) days.  Dispense: 4 capsule; Refill: 0  3. BMI 45.0-49.9, adult ([facility]CC)  4. Obesity with starting BMI of 48.0 Will refill Wegovy 0.5 mg SubQ once weekly for 1 month with 0 refills.  -Refill Semaglutide-Weight Management (WEGOVY) 0.5 MG/0.5ML SOAJ; Inject 0.5 mg into the skin once a week.   Dispense: 2 mL; Refill: 0  Demarie is currently in the action stage of change. As such, her goal is to continue with weight loss efforts. She has agreed to keeping a food journal and adhering to recommended goals of 1450-1550 calories and 90+ grams of protein daily.   Exercise goals: No exercise has been prescribed at this time.  Behavioral modification strategies: increasing lean protein intake, meal planning and cooking strategies, keeping healthy foods in the home, and planning for success.  Sherry Martinez has agreed to follow-up with our clinic in 3 weeks. She was informed of the importance of frequent follow-up visits to maximize her success with intensive lifestyle modifications for her multiple health conditions.   Objective:   Blood pressure 118/82, pulse 70, temperature 97.7 F (36.5 C), height '5\' 1"'$  (1.549 m), weight 253 lb (114.8 kg), last menstrual period 04/29/2014, SpO2 98 %. Body mass index is 47.8 kg/m.  General: Cooperative, alert, well developed, in no acute distress. HEENT: Conjunctivae and lids unremarkable. Cardiovascular: Regular rhythm.  Lungs: Normal work of breathing. Neurologic: No focal deficits.   Lab Results  Component Value Date   CREATININE 1.25 (H) 09/26/2022   BUN 31 (H) 09/26/2022   NA 141 09/26/2022   K 4.2 09/26/2022   CL 101 09/26/2022   CO2 23 09/26/2022   Lab Results  Component Value Date   ALT 17 09/26/2022   AST 20 09/26/2022   ALKPHOS 103 09/26/2022   BILITOT 0.3 09/26/2022   Lab Results  Component Value Date   HGBA1C  5.3 09/26/2022   Lab Results  Component Value Date   INSULIN 19.2 09/26/2022   Lab Results  Component Value Date   TSH 0.539 09/26/2022   Lab Results  Component Value Date   CHOL 198 09/26/2022   HDL 76 09/26/2022   LDLCALC 97 09/26/2022   TRIG 144 09/26/2022   Lab Results  Component Value Date   VD25OH 10.5 (L) 09/26/2022   Lab Results  Component Value Date   WBC 6.7 09/26/2022   HGB 14.2 09/26/2022    HCT 42.2 09/26/2022   MCV 97 09/26/2022   PLT 388 09/26/2022   No results found for: "IRON", "TIBC", "FERRITIN"  Attestation Statements:   Reviewed by clinician on day of visit: allergies, medications, problem list, medical history, surgical history, family history, social history, and previous encounter notes.  I, Elnora Morrison, RMA am acting as transcriptionist for Coralie Common, MD.  I have reviewed the above documentation for accuracy and completeness, and I agree with the above. - Coralie Common, MD

## 2023-01-11 ENCOUNTER — Ambulatory Visit (INDEPENDENT_AMBULATORY_CARE_PROVIDER_SITE_OTHER): Payer: BC Managed Care – PPO | Admitting: Family Medicine

## 2023-01-11 ENCOUNTER — Encounter (INDEPENDENT_AMBULATORY_CARE_PROVIDER_SITE_OTHER): Payer: Self-pay | Admitting: Family Medicine

## 2023-01-11 VITALS — BP 110/77 | HR 71 | Temp 98.2°F | Ht 61.0 in | Wt 248.0 lb

## 2023-01-11 DIAGNOSIS — I10 Essential (primary) hypertension: Secondary | ICD-10-CM | POA: Diagnosis not present

## 2023-01-11 DIAGNOSIS — Z6841 Body Mass Index (BMI) 40.0 and over, adult: Secondary | ICD-10-CM

## 2023-01-11 DIAGNOSIS — E559 Vitamin D deficiency, unspecified: Secondary | ICD-10-CM

## 2023-01-11 DIAGNOSIS — E669 Obesity, unspecified: Secondary | ICD-10-CM

## 2023-01-11 MED ORDER — VITAMIN D (ERGOCALCIFEROL) 1.25 MG (50000 UNIT) PO CAPS: 50000.0000 [IU] | ORAL_CAPSULE | ORAL | 0 refills | Status: DC

## 2023-01-11 MED ORDER — WEGOVY 0.5 MG/0.5ML ~~LOC~~ SOAJ: 0.5000 mg | SUBCUTANEOUS | 0 refills | Status: DC

## 2023-01-11 NOTE — Progress Notes (Signed)
Chief Complaint:   OBESITY Sherry Martinez is here to discuss her progress with her obesity treatment plan along with follow-up of her obesity related diagnoses. Sherry Martinez is on keeping a food journal and adhering to recommended goals of 1450-1550 calories and 90+ protein and states she is following her eating plan approximately 100% of the time. Sherry Martinez states she is not exercising.   Today's visit was #: 6 Starting weight: 47 LBS Starting date: 09/26/2022 Today's weight: 248 LBS Today's date: 01/11/2023 Total lbs lost to date: 6 LBS Total lbs lost since last in-office visit: 5 LBS  Interim History: Since last appointment she has been logging consistently.  She has been around 1468 calories daily average and between 102-110 g of protein daily. No plans for the next few weeks except work.  She is having some occasional GI issues of flatulence, constipation, diarrhea.  She is not experiencing any hunger.  Subjective:   1. Vitamin D deficiency Patient is on prescription vitamin D.  Patient denies nausea, vomiting, muscle weakness, but is positive for fatigue.  2. Essential hypertension Patient's blood pressure is well-controlled today.  Patient is taking Maxide, Toprol, HCTZ.  Assessment/Plan:   1. Vitamin D deficiency Refill- Vitamin D, Ergocalciferol, (DRISDOL) 1.25 MG (50000 UNIT) CAPS capsule; Take 1 capsule (50,000 Units total) by mouth every 7 (seven) days.  Dispense: 4 capsule; Refill: 0  2. Essential hypertension Continue current medications, no change in dose.  3. BMI 45.0-49.9, adult (Sherry Martinez)  4. Obesity with starting BMI of 48.0 Refill- Semaglutide-Weight Management (WEGOVY) 0.5 MG/0.5ML SOAJ; Inject 0.5 mg into the skin once a week.  Dispense: 2 mL; Refill: 0  Sherry Martinez is currently in the action stage of change. As such, her goal is to continue with weight loss efforts. She has agreed to keeping a food journal and adhering to recommended goals of 1450-1550 calories and 90+  protein daily.   Exercise goals: No exercise has been prescribed at this time.  Behavioral modification strategies: increasing lean protein intake, meal planning and cooking strategies, keeping healthy foods in the home, and planning for success.  Sherry Martinez has agreed to follow-up with our clinic in 4 weeks. She was informed of the importance of frequent follow-up visits to maximize her success with intensive lifestyle modifications for her multiple health conditions.   Objective:   Blood pressure 110/77, pulse 71, temperature 98.2 F (36.8 C), height 5\' 1"  (1.549 m), weight 248 lb (112.5 kg), last menstrual period 04/29/2014, SpO2 98 %. Body mass index is 46.86 kg/m.  General: Cooperative, alert, well developed, in no acute distress. HEENT: Conjunctivae and lids unremarkable. Cardiovascular: Regular rhythm.  Lungs: Normal work of breathing. Neurologic: No focal deficits.   Lab Results  Component Value Date   CREATININE 1.25 (H) 09/26/2022   BUN 31 (H) 09/26/2022   NA 141 09/26/2022   K 4.2 09/26/2022   CL 101 09/26/2022   CO2 23 09/26/2022   Lab Results  Component Value Date   ALT 17 09/26/2022   AST 20 09/26/2022   ALKPHOS 103 09/26/2022   BILITOT 0.3 09/26/2022   Lab Results  Component Value Date   HGBA1C 5.3 09/26/2022   Lab Results  Component Value Date   INSULIN 19.2 09/26/2022   Lab Results  Component Value Date   TSH 0.539 09/26/2022   Lab Results  Component Value Date   CHOL 198 09/26/2022   HDL 76 09/26/2022   LDLCALC 97 09/26/2022   TRIG 144 09/26/2022   Lab  Results  Component Value Date   VD25OH 10.5 (L) 09/26/2022   Lab Results  Component Value Date   WBC 6.7 09/26/2022   HGB 14.2 09/26/2022   HCT 42.2 09/26/2022   MCV 97 09/26/2022   PLT 388 09/26/2022   No results found for: "IRON", "TIBC", "FERRITIN"  Attestation Statements:   Reviewed by clinician on day of visit: allergies, medications, problem list, medical history, surgical  history, family history, social history, and previous encounter notes.  I, Davy Pique, RMA, am acting as transcriptionist for Coralie Common, MD.  I have reviewed the above documentation for accuracy and completeness, and I agree with the above. - Coralie Common, MD

## 2023-01-15 ENCOUNTER — Encounter (INDEPENDENT_AMBULATORY_CARE_PROVIDER_SITE_OTHER): Payer: Self-pay

## 2023-01-15 ENCOUNTER — Telehealth (INDEPENDENT_AMBULATORY_CARE_PROVIDER_SITE_OTHER): Payer: Self-pay | Admitting: Family Medicine

## 2023-01-15 NOTE — Telephone Encounter (Signed)
Prior authorization done for patients Wegovy. Waiting on determination.  (Key: [id number]) Rx #: [id number]

## 2023-01-17 ENCOUNTER — Telehealth (INDEPENDENT_AMBULATORY_CARE_PROVIDER_SITE_OTHER): Payer: Self-pay | Admitting: Family Medicine

## 2023-01-17 NOTE — Telephone Encounter (Signed)
Called and spoke w/ Alvis Lemmings, refaxed appeal to [employer]

## 2023-01-17 NOTE — Telephone Encounter (Signed)
Sherry Martinez with [employer] called wanting to know if we received the faxed denial letter. Sherry Martinez wants to make sure the received denial letter for pt's Sherry Martinez was faxed. (Ref #  [id number]). Please call Sherry Martinez with [employer] at [phone].

## 2023-01-31 ENCOUNTER — Encounter (INDEPENDENT_AMBULATORY_CARE_PROVIDER_SITE_OTHER): Payer: Self-pay

## 2023-01-31 NOTE — Telephone Encounter (Signed)
Please advise 

## 2023-02-02 ENCOUNTER — Encounter: Payer: Self-pay | Admitting: Physical Medicine & Rehabilitation

## 2023-02-02 ENCOUNTER — Encounter
Payer: BC Managed Care – PPO | Attending: Physical Medicine & Rehabilitation | Admitting: Physical Medicine & Rehabilitation

## 2023-02-02 VITALS — BP 135/87 | HR 71 | Temp 98.2°F | Ht 61.0 in | Wt 252.0 lb

## 2023-02-02 DIAGNOSIS — M1612 Unilateral primary osteoarthritis, left hip: Secondary | ICD-10-CM | POA: Insufficient documentation

## 2023-02-02 MED ORDER — BETAMETHASONE SOD PHOS & ACET 6 (3-3) MG/ML IJ SUSP
6.0000 mg | Freq: Once | INTRAMUSCULAR | Status: AC
  Administered 2023-02-02: 6 mg via INTRAMUSCULAR

## 2023-02-02 MED ORDER — LIDOCAINE HCL 1 % IJ SOLN
10.0000 mL | Freq: Once | INTRAMUSCULAR | Status: AC
  Administered 2023-02-02: 10 mL

## 2023-02-02 MED ORDER — LIDOCAINE HCL (PF) 1 % IJ SOLN: 4.0000 mL | Freq: Once | INTRAMUSCULAR | Status: DC

## 2023-02-02 NOTE — Progress Notes (Signed)
  PROCEDURE RECORD [facility]   Name: [name] DOB:1961-10-19 MRN: [id number]  Date:02/02/2023  Physician: Claudette Laws, MD    Nurse/CMA: Verlie Liotta S  Allergies:  Allergies  Allergen Reactions   Augmentin [Amoxicillin-Pot Clavulanate] Hives   Codeine    Demerol [Meperidine] Hives   Lisinopril Other (See Comments)   Meperidine Hcl Other (See Comments)   Other Other (See Comments)    Consent Signed: Yes.    Is patient diabetic? No.  CBG today? N/a  Pregnant: No. LMP: Patient's last menstrual period was 04/29/2014. (age [age])  Anticoagulants: no Anti-inflammatory: no Antibiotics: no  Procedure: Left Fluoroscopy guided hip injection  Position: Prone Start Time: 2:38 pm  End Time: 2:44 pm  Fluoro Time: 32  RN/CMA Donat Humble S Chelse Matas S    Time 1:34 2:49    BP 135/87 176/98    Pulse 71 76    Respirations 16 16    O2 Sat 97 95    S/S 6 6    Pain Level 8/10 6/10     D/C home with self, patient A & O X 3, D/C instructions reviewed, and sits independently.         Subjective:    Patient ID: Sherry Martinez, female    DOB: [birth year], 62 y.o.   MRN: [id number]  HPI    Review of Systems     Objective:   Physical Exam        Assessment & Plan:

## 2023-02-02 NOTE — Progress Notes (Signed)
Left hip intra-articular injection under fluoro guidance  Indication osteoarthritis unresponsive to conservative care including exercise and oral medications  Informed consent was obtained after describing risks and benefits of the procedure, this includes bleeding bruising and infection. The patient elected to proceed and has given written consent  Placed supine on fluoroscopy table.  Betadine prep to groin area.  Imaging to identify the intertrochanteric line at the base of the neck of the femur. 5 cc of 1% lidocaine were infiltrated into the skin and subcu tissue using 25-gauge 1.5 inch needle.  Then a 22-gauge 5" needle was inserted under fluoroscopic guidance targeting the junction of the femoral head and femoral neck area.  Bone contact was made.  Isovue 200 times a total of 3 cc were injected needle was adjusted to achieve intra-articular location. Then a solution containing 1 cc of 6 mg/cc Celestone and 4 cc of 1% lidocaine were injected.  Patient tolerated procedure well post procedure instructions given  

## 2023-02-03 LAB — BASIC METABOLIC PANEL WITH GFR
BUN/Creatinine Ratio: 27 (ref 12–28)
BUN: 34 mg/dL — ABNORMAL HIGH (ref 8–27)
CO2: 23 mmol/L (ref 20–29)
Calcium: 10.3 mg/dL (ref 8.7–10.3)
Chloride: 100 mmol/L (ref 96–106)
Creatinine, Ser: 1.28 mg/dL — ABNORMAL HIGH (ref 0.57–1.00)
Glucose: 86 mg/dL (ref 70–99)
Potassium: 4 mmol/L (ref 3.5–5.2)
Sodium: 143 mmol/L (ref 134–144)
eGFR: 48 mL/min/{1.73_m2} — ABNORMAL LOW

## 2023-02-05 ENCOUNTER — Telehealth: Payer: Self-pay | Admitting: *Deleted

## 2023-02-05 NOTE — Telephone Encounter (Signed)
-----   Message from Erick Colace, MD sent at 02/05/2023  1:41 PM EDT ----- Please notify pt that labwork mildly elevated but unchanged vs prior labs 4 mo ago.  May cont Meloxicam ----- Message ----- From: Nell Range Lab Results In Sent: 02/03/2023   5:37 AM EDT To: Erick Colace, MD

## 2023-02-05 NOTE — Telephone Encounter (Signed)
Notified Ms Zeitler.

## 2023-02-06 DIAGNOSIS — M1612 Unilateral primary osteoarthritis, left hip: Secondary | ICD-10-CM | POA: Diagnosis not present

## 2023-02-07 NOTE — Telephone Encounter (Signed)
Prior authorization Denied for patients Wegovy.

## 2023-02-08 ENCOUNTER — Telehealth (INDEPENDENT_AMBULATORY_CARE_PROVIDER_SITE_OTHER): Payer: BC Managed Care – PPO | Admitting: Family Medicine

## 2023-02-08 ENCOUNTER — Encounter (INDEPENDENT_AMBULATORY_CARE_PROVIDER_SITE_OTHER): Payer: Self-pay | Admitting: Family Medicine

## 2023-02-08 ENCOUNTER — Ambulatory Visit (INDEPENDENT_AMBULATORY_CARE_PROVIDER_SITE_OTHER): Payer: BC Managed Care – PPO | Admitting: Family Medicine

## 2023-02-08 DIAGNOSIS — R110 Nausea: Secondary | ICD-10-CM | POA: Diagnosis not present

## 2023-02-08 DIAGNOSIS — I10 Essential (primary) hypertension: Secondary | ICD-10-CM | POA: Diagnosis not present

## 2023-02-08 DIAGNOSIS — Z6841 Body Mass Index (BMI) 40.0 and over, adult: Secondary | ICD-10-CM

## 2023-02-08 MED ORDER — ONDANSETRON HCL 4 MG PO TABS: 4.0000 mg | ORAL_TABLET | Freq: Three times a day (TID) | ORAL | 0 refills | Status: DC | PRN

## 2023-02-08 MED ORDER — WEGOVY 1 MG/0.5ML ~~LOC~~ SOAJ: 1.0000 mg | SUBCUTANEOUS | 0 refills | Status: DC

## 2023-02-08 NOTE — Progress Notes (Signed)
TeleHealth Visit:  This visit was completed with telemedicine (audio/video) technology. Sherry Martinez has verbally consented to this TeleHealth visit. The patient is located at home, the provider is located at home. The participants in this visit include the listed provider and patient. The visit was conducted today via MyChart video.  OBESITY Sherry Martinez is here to discuss her progress with her obesity treatment plan along with follow-up of her obesity related diagnoses.   Today's visit was # 7 Starting weight: 254 LBS Starting date: 09/26/2022 Weight at last in office visit: 248 lbs on 01/11/23 Total weight loss: 6 lbs at last in office visit on 01/11/23. Today's reported weight (02/08/23): none reported  Nutrition Plan: keeping a food journal with goal of 1450-1550 calories and 90+ grams of protein daily   Current exercise:  none  Interim History:  She journals consistently and accurately. she is averaging 89-95 grams of protein daily.  She struggles to get in enough calories and often has about 1300/day.  She eats a balanced diet that includes fruits and vegetables.  She is trying to qualify for left total hip replacement.  She needs to lose to a 42 BMI (225 pounds). She is very happy with her progress.  She would like to titrate to the 1 mg dose of Wegovy.  Pharmacotherapy: Sherry Martinez is on Wegovy 0.50 mg SQ weekly Adverse side effects: Nausea-which has subsided mostly Hunger is well controlled.  Cravings are well controlled.  Assessment/Plan:  1.  Nausea Has had some nausea with Wegovy.  Currently this has mostly subsided but we are increasing her dose today to 1 mg weekly.  Plan: New prescription-Zofran 4 mg by mouth every 8 hours as needed for nausea  2. Hypertension Hypertension well controlled.  Last 3 recorded blood pressures have been at goal. Medication(s): Triamterene-HCTZ 75-50 mg daily.  BP Readings from Last 3 Encounters:  02/02/23 135/87  01/11/23 110/77  12/21/22  118/82   Lab Results  Component Value Date   CREATININE 1.28 (H) 02/02/2023   CREATININE 1.25 (H) 09/26/2022   CREATININE 0.78 05/06/2014   No results found for: "GFR"  Plan: Continue triamterene-HCTZ 75-50 mg daily.   3. Morbid Obesity: Current BMI 46 Pharmacotherapy Plan Continue and increase dose  Wegovy 1.0 mg SQ weekly Sherry Martinez is currently in the action stage of change. As such, her goal is to continue with weight loss efforts.  She has agreed to keeping a food journal with goal of 1350-1550 calories and 90+ grams of protein daily.  Exercise goals: No exercise has been prescribed at this time.  Behavioral modification strategies: planning for success.  Sherry Martinez has agreed to follow-up with our clinic in 4 weeks.   No orders of the defined types were placed in this encounter.   Medications Discontinued During This Encounter  Medication Reason   Semaglutide-Weight Management (WEGOVY) 0.5 MG/0.5ML SOAJ Dose change     Meds ordered this encounter  Medications   ondansetron (ZOFRAN) 4 MG tablet    Sig: Take 1 tablet (4 mg total) by mouth every 8 (eight) hours as needed for nausea or vomiting.    Dispense:  20 tablet    Refill:  0    Order Specific Question:   Supervising Provider    Answer:   Glennis Brink [[id number]]   Semaglutide-Weight Management (WEGOVY) 1 MG/0.5ML SOAJ    Sig: Inject 1 mg into the skin once a week.    Dispense:  2 mL    Refill:  0  Order Specific Question:   Supervising Provider    Answer:   Glennis Brink [[id number]]      Objective:   VITALS: Per patient if applicable, see vitals. GENERAL: Alert and in no acute distress. CARDIOPULMONARY: No increased WOB. Speaking in clear sentences.  PSYCH: Pleasant and cooperative. Speech normal rate and rhythm. Affect is appropriate. Insight and judgement are appropriate. Attention is focused, linear, and appropriate.  NEURO: Oriented as arrived to appointment on time with no prompting.   Attestation  Statements:   Reviewed by clinician on day of visit: allergies, medications, problem list, medical history, surgical history, family history, social history, and previous encounter notes.  This was prepared with the assistance of Engineer, civil (consulting).  Occasional wrong-word or sound-a-like substitutions may have occurred due to the inherent limitations of voice recognition software.

## 2023-02-21 ENCOUNTER — Other Ambulatory Visit: Payer: Self-pay | Admitting: Physical Medicine & Rehabilitation

## 2023-03-08 ENCOUNTER — Ambulatory Visit (INDEPENDENT_AMBULATORY_CARE_PROVIDER_SITE_OTHER): Payer: BC Managed Care – PPO | Admitting: Family Medicine

## 2023-03-08 ENCOUNTER — Encounter (INDEPENDENT_AMBULATORY_CARE_PROVIDER_SITE_OTHER): Payer: Self-pay | Admitting: Family Medicine

## 2023-03-08 VITALS — BP 117/78 | HR 68 | Temp 97.8°F | Ht 61.0 in | Wt 246.0 lb

## 2023-03-08 DIAGNOSIS — Z6841 Body Mass Index (BMI) 40.0 and over, adult: Secondary | ICD-10-CM | POA: Diagnosis not present

## 2023-03-08 DIAGNOSIS — R110 Nausea: Secondary | ICD-10-CM | POA: Diagnosis not present

## 2023-03-08 DIAGNOSIS — E559 Vitamin D deficiency, unspecified: Secondary | ICD-10-CM | POA: Diagnosis not present

## 2023-03-08 DIAGNOSIS — E669 Obesity, unspecified: Secondary | ICD-10-CM

## 2023-03-08 MED ORDER — VITAMIN D (ERGOCALCIFEROL) 1.25 MG (50000 UNIT) PO CAPS: 50000.0000 [IU] | ORAL_CAPSULE | ORAL | 0 refills | Status: DC

## 2023-03-08 MED ORDER — WEGOVY 1.7 MG/0.75ML ~~LOC~~ SOAJ: 1.7000 mg | SUBCUTANEOUS | 0 refills | Status: DC

## 2023-03-08 MED ORDER — ONDANSETRON HCL 4 MG PO TABS: 4.0000 mg | ORAL_TABLET | Freq: Three times a day (TID) | ORAL | 0 refills | Status: DC | PRN

## 2023-03-08 NOTE — Progress Notes (Signed)
Chief Complaint:   OBESITY Sherry Martinez is here to discuss her progress with her obesity treatment plan along with follow-up of her obesity related diagnoses. Sherry Martinez is on keeping a food journal and adhering to recommended goals of 1350-1550 calories and 90+ grams of protein and states she is following her eating plan approximately 95% of the time. Sherry Martinez states she is doing 0 minutes 0 times per week.  Today's visit was #: 8 Starting weight: 254 lbs Starting date: 09/26/2022 Today's weight: 246 lbs Today's date: 03/08/2023 Total lbs lost to date: 8 Total lbs lost since last in-office visit: 2  Interim History: For last few weeks she is focusing on protein intake daily.  She is getting up to 100g of protein a day.  She is eating a lot of chicken.  She feels very tired from all of this.  Averaging around 1300 calories a day for the week.  She isn't interested in eating and often has to force herself to eat.   Subjective:   1. Nausea Sherry Martinez is on Zofran for occasional nausea that she is experiencing with ZOXWRU.  She is noticing some constipation with Zofran.  2. Vitamin D deficiency Sherry Martinez is on prescription vitamin D, and she denies nausea, vomiting, or muscle weakness but she notes fatigue.  Assessment/Plan:   1. Nausea We will refill Zofran at 4 mg for 1 month.  - ondansetron (ZOFRAN) 4 MG tablet; Take 1 tablet (4 mg total) by mouth every 8 (eight) hours as needed for nausea or vomiting.  Dispense: 30 tablet; Refill: 0  2. Vitamin D deficiency We will refill prescription vitamin D for 1 month.  - Vitamin D, Ergocalciferol, (DRISDOL) 1.25 MG (50000 UNIT) CAPS capsule; Take 1 capsule (50,000 Units total) by mouth every 7 (seven) days.  Dispense: 4 capsule; Refill: 0  3. BMI 45.0-49.9, adult (HCC)  4. Obesity with starting BMI of 48.0 Sherry Martinez agreed to increase Wegovy to 1.7 mg subQ weekly, and we will refill for 1 month.  - Semaglutide-Weight Management (WEGOVY) 1.7 MG/0.75ML  SOAJ; Inject 1.7 mg into the skin once a week.  Dispense: 3 mL; Refill: 0  Sherry Martinez is currently in the action stage of change. As such, her goal is to continue with weight loss efforts. She has agreed to keeping a food journal and adhering to recommended goals of 1450-1550 calories and 90+ grams of protein daily.   Exercise goals: No exercise has been prescribed at this time.  Behavioral modification strategies: increasing lean protein intake, meal planning and cooking strategies, keeping healthy foods in the home, and planning for success.  Sherry Martinez has agreed to follow-up with our clinic in 3 weeks. She was informed of the importance of frequent follow-up visits to maximize her success with intensive lifestyle modifications for her multiple health conditions.   Objective:   Blood pressure 117/78, pulse 68, temperature 97.8 F (36.6 C), height 5\' 1"  (1.549 m), weight 246 lb (111.6 kg), last menstrual period 04/29/2014, SpO2 98 %. Body mass index is 46.48 kg/m.  General: Cooperative, alert, well developed, in no acute distress. HEENT: Conjunctivae and lids unremarkable. Cardiovascular: Regular rhythm.  Lungs: Normal work of breathing. Neurologic: No focal deficits.   Lab Results  Component Value Date   CREATININE 1.28 (H) 02/02/2023   BUN 34 (H) 02/02/2023   NA 143 02/02/2023   K 4.0 02/02/2023   CL 100 02/02/2023   CO2 23 02/02/2023   Lab Results  Component Value Date   ALT 17  09/26/2022   AST 20 09/26/2022   ALKPHOS 103 09/26/2022   BILITOT 0.3 09/26/2022   Lab Results  Component Value Date   HGBA1C 5.3 09/26/2022   Lab Results  Component Value Date   INSULIN 19.2 09/26/2022   Lab Results  Component Value Date   TSH 0.539 09/26/2022   Lab Results  Component Value Date   CHOL 198 09/26/2022   HDL 76 09/26/2022   LDLCALC 97 09/26/2022   TRIG 144 09/26/2022   Lab Results  Component Value Date   VD25OH 10.5 (L) 09/26/2022   Lab Results  Component Value Date    WBC 6.7 09/26/2022   HGB 14.2 09/26/2022   HCT 42.2 09/26/2022   MCV 97 09/26/2022   PLT 388 09/26/2022   No results found for: "IRON", "TIBC", "FERRITIN"  Attestation Statements:   Reviewed by clinician on day of visit: allergies, medications, problem list, medical history, surgical history, family history, social history, and previous encounter notes.   I, Burt Knack, am acting as transcriptionist for Reuben Likes, MD.  I have reviewed the above documentation for accuracy and completeness, and I agree with the above. - Reuben Likes, MD

## 2023-03-28 ENCOUNTER — Ambulatory Visit (INDEPENDENT_AMBULATORY_CARE_PROVIDER_SITE_OTHER): Payer: BC Managed Care – PPO | Admitting: Family Medicine

## 2023-03-28 ENCOUNTER — Encounter: Payer: Self-pay | Admitting: Orthopaedic Surgery

## 2023-03-28 VITALS — BP 124/78 | HR 75 | Temp 98.2°F | Ht 61.0 in | Wt 244.0 lb

## 2023-03-28 DIAGNOSIS — Z6841 Body Mass Index (BMI) 40.0 and over, adult: Secondary | ICD-10-CM | POA: Diagnosis not present

## 2023-03-28 DIAGNOSIS — E669 Obesity, unspecified: Secondary | ICD-10-CM | POA: Diagnosis not present

## 2023-03-28 DIAGNOSIS — E559 Vitamin D deficiency, unspecified: Secondary | ICD-10-CM

## 2023-03-28 DIAGNOSIS — I10 Essential (primary) hypertension: Secondary | ICD-10-CM | POA: Diagnosis not present

## 2023-03-28 MED ORDER — VITAMIN D (ERGOCALCIFEROL) 1.25 MG (50000 UNIT) PO CAPS: 50000.0000 [IU] | ORAL_CAPSULE | ORAL | 0 refills | Status: DC

## 2023-03-28 MED ORDER — WEGOVY 1.7 MG/0.75ML ~~LOC~~ SOAJ: 1.7000 mg | SUBCUTANEOUS | 0 refills | Status: DC

## 2023-03-28 MED ORDER — TRIAMTERENE-HCTZ 75-50 MG PO TABS: 0.5000 | ORAL_TABLET | Freq: Every day | ORAL | 0 refills | Status: DC

## 2023-03-28 NOTE — Progress Notes (Signed)
Chief Complaint:   OBESITY Sherry Martinez is here to discuss her progress with her obesity treatment plan along with follow-up of her obesity related diagnoses. Sherena is on keeping a food journal and adhering to recommended goals of 1450-1550 calories and 90+ protein and states she is following her eating plan approximately 95% of the time. Jovanni states she is not exercising.   Today's visit was #: 9 Starting weight: 254 lbs Starting date: 09/26/2022 Today's weight: 244 lbs Today's date: 03/28/2023 Total lbs lost to date: 10 lbs Total lbs lost since last in-office visit: 2 lbs  Interim History: Patient has been eating around 1500 calories and protein is between 80-95g/ day.  She is forcing herself to eat.  Stayed at home for [facility] Day weekend.  She is not anticipating any events or activities the next few weeks.  Subjective:   1. Essential hypertension Blood pressure controlled today.  Patient denies chest pain, chest pressure or headache. Patient is taking 1/2 tablet Maxzide and additional HCTZ.   2. Vitamin D deficiency Patient is on prescription Vitamin D.  Last Vitamin D level was 6 months.   Assessment/Plan:   1. Essential hypertension Follow up blood pressure at next appointment.   Refill - triamterene-hydrochlorothiazide (MAXZIDE) 75-50 MG tablet; Take 0.5 tablets by mouth daily.  Dispense: 90 tablet; Refill: 0  2. Vitamin D deficiency Check Vitamin D level with PCP tomorrow.   Refill - Vitamin D, Ergocalciferol, (DRISDOL) 1.25 MG (50000 UNIT) CAPS capsule; Take 1 capsule (50,000 Units total) by mouth every 7 (seven) days.  Dispense: 4 capsule; Refill: 0  3. BMI 45.0-49.9, adult (HCC)  4. Obesity with starting BMI of 48.0 Refill - Semaglutide-Weight Management (WEGOVY) 1.7 MG/0.75ML SOAJ; Inject 1.7 mg into the skin once a week.  Dispense: 9 mL; Refill: 0  Libertie is currently in the action stage of change. As such, her goal is to continue with weight loss efforts.  She has agreed to keeping a food journal and adhering to recommended goals of 1450-1550 calories and 90+ protein.   Exercise goals: All adults should avoid inactivity. Some physical activity is better than none, and adults who participate in any amount of physical activity gain some health benefits. Try sofa exercises 3x per week.  Patient is interested in sofa exercises.   Behavioral modification strategies: increasing lean protein intake, meal planning and cooking strategies, keeping healthy foods in the home, and planning for success.  Aqueelah has agreed to follow-up with our clinic in 3 weeks. She was informed of the importance of frequent follow-up visits to maximize her success with intensive lifestyle modifications for her multiple health conditions.   Objective:   Blood pressure 124/78, pulse 75, temperature 98.2 F (36.8 C), height 5\' 1"  (1.549 m), weight 244 lb (110.7 kg), last menstrual period 04/29/2014, SpO2 97 %. Body mass index is 46.1 kg/m.  General: Cooperative, alert, well developed, in no acute distress. HEENT: Conjunctivae and lids unremarkable. Cardiovascular: Regular rhythm.  Lungs: Normal work of breathing. Neurologic: No focal deficits.   Lab Results  Component Value Date   CREATININE 1.28 (H) 02/02/2023   BUN 34 (H) 02/02/2023   NA 143 02/02/2023   K 4.0 02/02/2023   CL 100 02/02/2023   CO2 23 02/02/2023   Lab Results  Component Value Date   ALT 17 09/26/2022   AST 20 09/26/2022   ALKPHOS 103 09/26/2022   BILITOT 0.3 09/26/2022   Lab Results  Component Value Date   HGBA1C  5.3 09/26/2022   Lab Results  Component Value Date   INSULIN 19.2 09/26/2022   Lab Results  Component Value Date   TSH 0.539 09/26/2022   Lab Results  Component Value Date   CHOL 198 09/26/2022   HDL 76 09/26/2022   LDLCALC 97 09/26/2022   TRIG 144 09/26/2022   Lab Results  Component Value Date   VD25OH 10.5 (L) 09/26/2022   Lab Results  Component Value Date   WBC  6.7 09/26/2022   HGB 14.2 09/26/2022   HCT 42.2 09/26/2022   MCV 97 09/26/2022   PLT 388 09/26/2022   No results found for: "IRON", "TIBC", "FERRITIN"  Attestation Statements:   Reviewed by clinician on day of visit: allergies, medications, problem list, medical history, surgical history, family history, social history, and previous encounter notes.  I, Malcolm Metro, RMA, am acting as transcriptionist for Reuben Likes, MD.  I have reviewed the above documentation for accuracy and completeness, and I agree with the above. - Reuben Likes, MD

## 2023-03-29 DIAGNOSIS — I10 Essential (primary) hypertension: Secondary | ICD-10-CM | POA: Diagnosis not present

## 2023-03-29 DIAGNOSIS — F3341 Major depressive disorder, recurrent, in partial remission: Secondary | ICD-10-CM | POA: Diagnosis not present

## 2023-03-29 DIAGNOSIS — E782 Mixed hyperlipidemia: Secondary | ICD-10-CM | POA: Diagnosis not present

## 2023-03-29 DIAGNOSIS — E039 Hypothyroidism, unspecified: Secondary | ICD-10-CM | POA: Diagnosis not present

## 2023-03-29 DIAGNOSIS — Z0000 Encounter for general adult medical examination without abnormal findings: Secondary | ICD-10-CM | POA: Diagnosis not present

## 2023-04-16 ENCOUNTER — Other Ambulatory Visit (INDEPENDENT_AMBULATORY_CARE_PROVIDER_SITE_OTHER): Payer: Self-pay

## 2023-04-16 ENCOUNTER — Encounter (INDEPENDENT_AMBULATORY_CARE_PROVIDER_SITE_OTHER): Payer: Self-pay | Admitting: Family Medicine

## 2023-04-16 DIAGNOSIS — R110 Nausea: Secondary | ICD-10-CM

## 2023-04-16 MED ORDER — ONDANSETRON HCL 4 MG PO TABS
4.0000 mg | ORAL_TABLET | Freq: Three times a day (TID) | ORAL | 0 refills | Status: DC | PRN
Start: 2023-04-16 — End: 2023-05-08

## 2023-04-20 ENCOUNTER — Other Ambulatory Visit: Payer: Self-pay | Admitting: Physical Medicine & Rehabilitation

## 2023-04-25 ENCOUNTER — Ambulatory Visit (INDEPENDENT_AMBULATORY_CARE_PROVIDER_SITE_OTHER): Payer: BC Managed Care – PPO | Admitting: Family Medicine

## 2023-04-25 ENCOUNTER — Encounter (INDEPENDENT_AMBULATORY_CARE_PROVIDER_SITE_OTHER): Payer: Self-pay | Admitting: Family Medicine

## 2023-04-25 VITALS — BP 121/77 | HR 76 | Temp 98.2°F | Ht 61.0 in | Wt 239.0 lb

## 2023-04-25 DIAGNOSIS — Z6841 Body Mass Index (BMI) 40.0 and over, adult: Secondary | ICD-10-CM | POA: Diagnosis not present

## 2023-04-25 DIAGNOSIS — E559 Vitamin D deficiency, unspecified: Secondary | ICD-10-CM

## 2023-04-25 DIAGNOSIS — E669 Obesity, unspecified: Secondary | ICD-10-CM

## 2023-04-25 DIAGNOSIS — E039 Hypothyroidism, unspecified: Secondary | ICD-10-CM

## 2023-04-25 MED ORDER — LEVOTHYROXINE SODIUM 125 MCG PO TABS: 125.0000 ug | ORAL_TABLET | Freq: Every day | ORAL | 0 refills | Status: DC

## 2023-04-25 MED ORDER — VITAMIN D (ERGOCALCIFEROL) 1.25 MG (50000 UNIT) PO CAPS: 50000.0000 [IU] | ORAL_CAPSULE | ORAL | 0 refills | Status: DC

## 2023-04-25 MED ORDER — VITAMIN D3 125 MCG (5000 UT) PO CAPS
5000.0000 [IU] | ORAL_CAPSULE | Freq: Every day | ORAL | Status: AC
Start: 2023-04-25 — End: ?

## 2023-04-25 NOTE — Progress Notes (Signed)
Chief Complaint:   OBESITY Sherry Martinez is here to discuss her progress with her obesity treatment plan along with follow-up of her obesity related diagnoses. Sherry Martinez is on keeping a food journal and adhering to recommended goals of 1450-1550 calories and 90+ grams of protein and states she is following her eating plan approximately 95% of the time. Sherry Martinez states she is doing chair exercises for 30-35 minutes 4 times per week.  Today's visit was #: 10 Starting weight: 254 lbs Starting date: 09/26/2022 Today's weight: 239 lbs Today's date: 04/25/2023 Total lbs lost to date: 15 Total lbs lost since last in-office visit: 5  Interim History: Patient has gotten her protein now up to around 90g and her calories have been around 1500 average daily.  She was vomiting for a while (about 10 days about 1x a week) and timing was variable.  She feels better now and hasn't vomited in 4 days (took her most recent injection on last day she vomited).  She is planning to stay home for July 4th. No plans for the next few weeks except for work.  Last sent in a 3 month supply of Wegovy.   Subjective:   1. Vitamin D deficiency Patient is on prescription vitamin D.  Her last vitamin D level was of 57.6.  2. Hypothyroidism, unspecified type Patient's recent TSH was 0.05.  She is on levothyroxine 137 mcg.  Assessment/Plan:   1. Vitamin D deficiency Patient is to discontinue prescription vitamin D, and switch to vitamin D 5000 units daily.  - Cholecalciferol (VITAMIN D3) 125 MCG (5000 UT) CAPS; Take 1 capsule (5,000 Units total) by mouth daily.  Dispense: 30 capsule  2. Hypothyroidism, unspecified type Patient agreed to decrease levothyroxine to 125 mcg daily, and we will refill for 1 month.  We will repeat labs in 6 weeks.  - levothyroxine (SYNTHROID) 125 MCG tablet; Take 1 tablet (125 mcg total) by mouth daily before breakfast.  Dispense: 45 tablet; Refill: 0  3. Obesity with starting BMI of 48.0  4.  BMI 45.0-49.9, adult Sherry Martinez) Sherry Martinez is currently in the action stage of change. As such, her goal is to continue with weight loss efforts. She has agreed to keeping a food journal and adhering to recommended goals of 1450-1550 calories and 90+ grams of protein daily.   Exercise goals: All adults should avoid inactivity. Some physical activity is better than none, and adults who participate in any amount of physical activity gain some health benefits.  Behavioral modification strategies: increasing lean protein intake, meal planning and cooking strategies, keeping healthy foods in the home, and planning for success.  Sherry Martinez has agreed to follow-up with our clinic in 4 weeks. She was informed of the importance of frequent follow-up visits to maximize her success with intensive lifestyle modifications for her multiple health conditions.   Objective:   Blood pressure 121/77, pulse 76, temperature 98.2 F (36.8 C), height 5\' 1"  (1.549 m), weight 239 lb (108.4 kg), last menstrual period 04/29/2014, SpO2 96 %. Body mass index is 45.16 kg/m.  General: Cooperative, alert, well developed, in no acute distress. HEENT: Conjunctivae and lids unremarkable. Cardiovascular: Regular rhythm.  Lungs: Normal work of breathing. Neurologic: No focal deficits.   Lab Results  Component Value Date   CREATININE 1.28 (H) 02/02/2023   BUN 34 (H) 02/02/2023   NA 143 02/02/2023   K 4.0 02/02/2023   CL 100 02/02/2023   CO2 23 02/02/2023   Lab Results  Component Value Date  ALT 17 09/26/2022   AST 20 09/26/2022   ALKPHOS 103 09/26/2022   BILITOT 0.3 09/26/2022   Lab Results  Component Value Date   HGBA1C 5.3 09/26/2022   Lab Results  Component Value Date   INSULIN 19.2 09/26/2022   Lab Results  Component Value Date   TSH 0.539 09/26/2022   Lab Results  Component Value Date   CHOL 198 09/26/2022   HDL 76 09/26/2022   LDLCALC 97 09/26/2022   TRIG 144 09/26/2022   Lab Results  Component Value  Date   VD25OH 10.5 (L) 09/26/2022   Lab Results  Component Value Date   WBC 6.7 09/26/2022   HGB 14.2 09/26/2022   HCT 42.2 09/26/2022   MCV 97 09/26/2022   PLT 388 09/26/2022   No results found for: "IRON", "TIBC", "FERRITIN"  Attestation Statements:   Reviewed by clinician on day of visit: allergies, medications, problem list, medical history, surgical history, family history, social history, and previous encounter notes.   I, Burt Knack, am acting as transcriptionist for Reuben Likes, MD.  I have reviewed the above documentation for accuracy and completeness, and I agree with the above. - Reuben Likes, MD

## 2023-05-01 ENCOUNTER — Encounter: Payer: Self-pay | Admitting: Physical Medicine & Rehabilitation

## 2023-05-01 MED ORDER — TRAMADOL HCL 50 MG PO TABS: 50.0000 mg | ORAL_TABLET | Freq: Two times a day (BID) | ORAL | 5 refills | Status: AC

## 2023-05-08 ENCOUNTER — Encounter (INDEPENDENT_AMBULATORY_CARE_PROVIDER_SITE_OTHER): Payer: Self-pay | Admitting: Family Medicine

## 2023-05-08 ENCOUNTER — Other Ambulatory Visit (INDEPENDENT_AMBULATORY_CARE_PROVIDER_SITE_OTHER): Payer: Self-pay

## 2023-05-08 DIAGNOSIS — R110 Nausea: Secondary | ICD-10-CM

## 2023-05-08 MED ORDER — ONDANSETRON HCL 4 MG PO TABS
4.0000 mg | ORAL_TABLET | Freq: Three times a day (TID) | ORAL | 0 refills | Status: DC | PRN
Start: 2023-05-08 — End: 2023-06-12

## 2023-05-22 ENCOUNTER — Ambulatory Visit (INDEPENDENT_AMBULATORY_CARE_PROVIDER_SITE_OTHER): Payer: BC Managed Care – PPO | Admitting: Family Medicine

## 2023-05-22 ENCOUNTER — Encounter (INDEPENDENT_AMBULATORY_CARE_PROVIDER_SITE_OTHER): Payer: Self-pay | Admitting: Family Medicine

## 2023-05-22 VITALS — BP 110/77 | HR 67 | Temp 98.1°F | Ht 61.0 in | Wt 233.0 lb

## 2023-05-22 DIAGNOSIS — E669 Obesity, unspecified: Secondary | ICD-10-CM

## 2023-05-22 DIAGNOSIS — I10 Essential (primary) hypertension: Secondary | ICD-10-CM

## 2023-05-22 DIAGNOSIS — Z6841 Body Mass Index (BMI) 40.0 and over, adult: Secondary | ICD-10-CM | POA: Diagnosis not present

## 2023-05-22 MED ORDER — WEGOVY 1.7 MG/0.75ML ~~LOC~~ SOAJ
1.7000 mg | SUBCUTANEOUS | 0 refills | Status: DC
Start: 2023-05-22 — End: 2023-06-26

## 2023-05-22 NOTE — Progress Notes (Unsigned)
Chief Complaint:   OBESITY Sherry Martinez is here to discuss her progress with her obesity treatment plan along with follow-up of her obesity related diagnoses. Sherry Martinez is on keeping a food journal and adhering to recommended goals of 1450-1550 calories and 90+ grams of protein and states she is following her eating plan approximately 90% of the time. Sherry Martinez states she is doing chair exercises for 30 minutes 4 times per week.  Today's visit was #: 11 Starting weight: 254 lbs Starting date: 09/26/2022 Today's weight: 233 lbs Today's date: 05/22/2023 Total lbs lost to date: 21 Total lbs lost since last in-office visit: 6  Interim History: Patient has been mostly working; had a quiet July 4th. Next few weeks she has more of the same- work and home life.  She is hopeful that she will have surgery in the next month.  She voices she will call ortho when her BMI is 42.   Subjective:   1. Essential hypertension Patient's blood pressure is controlled today.  She denies chest pain, chest pressure, or headache.  Assessment/Plan:   1. Essential hypertension Patient will continue her current medications with no change in doses.  2. BMI 40.0-44.9, adult (HCC)  3. Obesity with starting BMI of 48.0 We will refill Wegovy 1.7 mg once weekly for 90 days.  - Semaglutide-Weight Management (WEGOVY) 1.7 MG/0.75ML SOAJ; Inject 1.7 mg into the skin once a week.  Dispense: 9 mL; Refill: 0  Sherry Martinez is currently in the action stage of change. As such, her goal is to continue with weight loss efforts. She has agreed to keeping a food journal and adhering to recommended goals of 1450-1550 calories and 90+ grams of protein daily.   Exercise goals: All adults should avoid inactivity. Some physical activity is better than none, and adults who participate in any amount of physical activity gain some health benefits.  Behavioral modification strategies: increasing lean protein intake, meal planning and cooking  strategies, keeping healthy foods in the home, planning for success, and keeping a strict food journal.  Sherry Martinez has agreed to follow-up with our clinic in 5 weeks. She was informed of the importance of frequent follow-up visits to maximize her success with intensive lifestyle modifications for her multiple health conditions.   Objective:   Blood pressure 110/77, pulse 67, temperature 98.1 F (36.7 C), height 5\' 1"  (1.549 m), weight 233 lb (105.7 kg), last menstrual period 04/29/2014, SpO2 97%. Body mass index is 44.02 kg/m.  General: Cooperative, alert, well developed, in no acute distress. HEENT: Conjunctivae and lids unremarkable. Cardiovascular: Regular rhythm.  Lungs: Normal work of breathing. Neurologic: No focal deficits.   Lab Results  Component Value Date   CREATININE 1.28 (H) 02/02/2023   BUN 34 (H) 02/02/2023   NA 143 02/02/2023   K 4.0 02/02/2023   CL 100 02/02/2023   CO2 23 02/02/2023   Lab Results  Component Value Date   ALT 17 09/26/2022   AST 20 09/26/2022   ALKPHOS 103 09/26/2022   BILITOT 0.3 09/26/2022   Lab Results  Component Value Date   HGBA1C 5.3 09/26/2022   Lab Results  Component Value Date   INSULIN 19.2 09/26/2022   Lab Results  Component Value Date   TSH 0.539 09/26/2022   Lab Results  Component Value Date   CHOL 198 09/26/2022   HDL 76 09/26/2022   LDLCALC 97 09/26/2022   TRIG 144 09/26/2022   Lab Results  Component Value Date   VD25OH 10.5 (L) 09/26/2022  Lab Results  Component Value Date   WBC 6.7 09/26/2022   HGB 14.2 09/26/2022   HCT 42.2 09/26/2022   MCV 97 09/26/2022   PLT 388 09/26/2022   No results found for: "IRON", "TIBC", "FERRITIN"  Attestation Statements:   Reviewed by clinician on day of visit: allergies, medications, problem list, medical history, surgical history, family history, social history, and previous encounter notes.   I, Burt Knack, am acting as transcriptionist for Reuben Likes,  MD. I have reviewed the above documentation for accuracy and completeness, and I agree with the above. - Reuben Likes, MD

## 2023-05-27 ENCOUNTER — Other Ambulatory Visit (INDEPENDENT_AMBULATORY_CARE_PROVIDER_SITE_OTHER): Payer: Self-pay | Admitting: Family Medicine

## 2023-05-27 DIAGNOSIS — E039 Hypothyroidism, unspecified: Secondary | ICD-10-CM

## 2023-06-12 ENCOUNTER — Encounter (INDEPENDENT_AMBULATORY_CARE_PROVIDER_SITE_OTHER): Payer: Self-pay | Admitting: Family Medicine

## 2023-06-12 ENCOUNTER — Other Ambulatory Visit (INDEPENDENT_AMBULATORY_CARE_PROVIDER_SITE_OTHER): Payer: Self-pay

## 2023-06-12 DIAGNOSIS — R110 Nausea: Secondary | ICD-10-CM

## 2023-06-12 MED ORDER — ONDANSETRON HCL 4 MG PO TABS
4.0000 mg | ORAL_TABLET | Freq: Three times a day (TID) | ORAL | 0 refills | Status: DC | PRN
Start: 2023-06-12 — End: 2023-07-16

## 2023-06-26 ENCOUNTER — Encounter (INDEPENDENT_AMBULATORY_CARE_PROVIDER_SITE_OTHER): Payer: Self-pay | Admitting: Family Medicine

## 2023-06-26 ENCOUNTER — Ambulatory Visit (INDEPENDENT_AMBULATORY_CARE_PROVIDER_SITE_OTHER): Payer: BC Managed Care – PPO | Admitting: Family Medicine

## 2023-06-26 VITALS — BP 110/77 | HR 73 | Temp 98.1°F | Ht 61.0 in | Wt 225.0 lb

## 2023-06-26 DIAGNOSIS — I10 Essential (primary) hypertension: Secondary | ICD-10-CM

## 2023-06-26 DIAGNOSIS — E669 Obesity, unspecified: Secondary | ICD-10-CM

## 2023-06-26 DIAGNOSIS — Z6841 Body Mass Index (BMI) 40.0 and over, adult: Secondary | ICD-10-CM

## 2023-06-26 MED ORDER — WEGOVY 1.7 MG/0.75ML ~~LOC~~ SOAJ
1.7000 mg | SUBCUTANEOUS | 0 refills | Status: DC
Start: 2023-06-26 — End: 2023-10-04

## 2023-06-26 NOTE — Progress Notes (Signed)
Chief Complaint:   OBESITY Sherry Martinez is here to discuss her progress with her obesity treatment plan along with follow-up of her obesity related diagnoses. Sherry Martinez is on keeping a food journal and adhering to recommended goals of 1450-1550 calories and 90+ grams of protein and states she is following her eating plan approximately 100% of the time. Sherry Martinez states she is doing chair exercises for 30 minutes 3-4 times per week.  Today's visit was #: 12 Starting weight: 254 lbs Starting date: 09/26/2022 Today's weight: 225 lbs Today's date: 06/26/2023 Total lbs lost to date: 29 Total lbs lost since last in-office visit: 8  Interim History: Patient has been mostly working since last appointment and she mentions that she has finally reached her goal BMI for hip replacement surgery.  She is doing well on Wegovy.  She is able to get all the food in.  No hunger and no food ruminations.  She is eating smaller meals more frequently.   Subjective:   1. Essential hypertension Patient's blood pressure is controlled today.  She is on Maxzide 75-50 mg currently.  She has an appointment with her PCP next month.  Assessment/Plan:   1. Essential hypertension Patient is to discuss her dosage with her PCP at her next scheduled appointment.  2. BMI 40.0-44.9, adult (HCC)  3. Obesity with starting BMI of 48.0 We will refill Wegovy 1.7 mg subcu weekly for 90 days.  - Semaglutide-Weight Management (WEGOVY) 1.7 MG/0.75ML SOAJ; Inject 1.7 mg into the skin once a week.  Dispense: 9 mL; Refill: 0  Sherry Martinez is currently in the action stage of change. As such, her goal is to continue with weight loss efforts. She has agreed to keeping a food journal and adhering to recommended goals of 1450-1550 calories and 90+ grams of protein daily.   Exercise goals: As is.   Behavioral modification strategies: increasing lean protein intake, meal planning and cooking strategies, keeping healthy foods in the home, and  planning for success.  Sherry Martinez has agreed to follow-up with our clinic in 8 weeks. She was informed of the importance of frequent follow-up visits to maximize her success with intensive lifestyle modifications for her multiple health conditions.   Objective:   Blood pressure 110/77, pulse 73, temperature 98.1 F (36.7 C), height 5\' 1"  (1.549 m), weight 225 lb (102.1 kg), last menstrual period 04/29/2014. Body mass index is 42.51 kg/m.  General: Cooperative, alert, well developed, in no acute distress. HEENT: Conjunctivae and lids unremarkable. Cardiovascular: Regular rhythm.  Lungs: Normal work of breathing. Neurologic: No focal deficits.   Lab Results  Component Value Date   CREATININE 1.28 (H) 02/02/2023   BUN 34 (H) 02/02/2023   NA 143 02/02/2023   K 4.0 02/02/2023   CL 100 02/02/2023   CO2 23 02/02/2023   Lab Results  Component Value Date   ALT 17 09/26/2022   AST 20 09/26/2022   ALKPHOS 103 09/26/2022   BILITOT 0.3 09/26/2022   Lab Results  Component Value Date   HGBA1C 5.3 09/26/2022   Lab Results  Component Value Date   INSULIN 19.2 09/26/2022   Lab Results  Component Value Date   TSH 0.539 09/26/2022   Lab Results  Component Value Date   CHOL 198 09/26/2022   HDL 76 09/26/2022   LDLCALC 97 09/26/2022   TRIG 144 09/26/2022   Lab Results  Component Value Date   VD25OH 10.5 (L) 09/26/2022   Lab Results  Component Value Date   WBC  6.7 09/26/2022   HGB 14.2 09/26/2022   HCT 42.2 09/26/2022   MCV 97 09/26/2022   PLT 388 09/26/2022   No results found for: "IRON", "TIBC", "FERRITIN"  Attestation Statements:   Reviewed by clinician on day of visit: allergies, medications, problem list, medical history, surgical history, family history, social history, and previous encounter notes.   I, Burt Knack, am acting as transcriptionist for Reuben Likes, MD.  I have reviewed the above documentation for accuracy and completeness, and I agree with  the above. - Reuben Likes, MD

## 2023-06-28 DIAGNOSIS — M1612 Unilateral primary osteoarthritis, left hip: Secondary | ICD-10-CM | POA: Diagnosis not present

## 2023-07-12 ENCOUNTER — Other Ambulatory Visit (INDEPENDENT_AMBULATORY_CARE_PROVIDER_SITE_OTHER): Payer: Self-pay | Admitting: Family Medicine

## 2023-07-12 DIAGNOSIS — R110 Nausea: Secondary | ICD-10-CM

## 2023-07-14 DIAGNOSIS — U071 COVID-19: Secondary | ICD-10-CM | POA: Diagnosis not present

## 2023-07-15 ENCOUNTER — Encounter (INDEPENDENT_AMBULATORY_CARE_PROVIDER_SITE_OTHER): Payer: Self-pay | Admitting: Family Medicine

## 2023-07-16 ENCOUNTER — Other Ambulatory Visit: Payer: Self-pay | Admitting: Bariatrics

## 2023-07-16 DIAGNOSIS — R110 Nausea: Secondary | ICD-10-CM

## 2023-07-16 MED ORDER — ONDANSETRON HCL 4 MG PO TABS
4.0000 mg | ORAL_TABLET | Freq: Three times a day (TID) | ORAL | 0 refills | Status: DC | PRN
Start: 2023-07-16 — End: 2023-08-06

## 2023-07-16 NOTE — Telephone Encounter (Signed)
LAST APPOINTMENT DATE: 06/23/23 NEXT APPOINTMENT DATE: 08/22/23   [id number] - [place], [place] - [address] [place] [address] Phone: [phone] Fax: [phone]  [facility] - [place], [place] - [address] [place] [address] Phone: [phone] Fax: [phone]  Patient is requesting a refill of the following medications: No prescriptions requested or ordered in this encounter   Date last filled: 06/12/23 Previously prescribed by Dr Lawson Radar  Lab Results      Component                Value               Date                      HGBA1C                   5.3                 09/26/2022           Lab Results      Component                Value               Date                      LDLCALC                  97                  09/26/2022                CREATININE               1.28 (H)            02/02/2023           Lab Results      Component                Value               Date                      VD25OH                   10.5 (L)            09/26/2022            BP Readings from Last 3 Encounters: 06/26/23 : 110/77 05/22/23 : 110/77 04/25/23 : 121/77

## 2023-07-25 ENCOUNTER — Other Ambulatory Visit (INDEPENDENT_AMBULATORY_CARE_PROVIDER_SITE_OTHER): Payer: Self-pay | Admitting: Family Medicine

## 2023-07-25 DIAGNOSIS — E039 Hypothyroidism, unspecified: Secondary | ICD-10-CM

## 2023-07-31 ENCOUNTER — Telehealth: Payer: Self-pay

## 2023-07-31 DIAGNOSIS — G4733 Obstructive sleep apnea (adult) (pediatric): Secondary | ICD-10-CM | POA: Diagnosis not present

## 2023-07-31 DIAGNOSIS — Z23 Encounter for immunization: Secondary | ICD-10-CM | POA: Diagnosis not present

## 2023-07-31 DIAGNOSIS — R262 Difficulty in walking, not elsewhere classified: Secondary | ICD-10-CM | POA: Diagnosis not present

## 2023-07-31 DIAGNOSIS — E039 Hypothyroidism, unspecified: Secondary | ICD-10-CM | POA: Diagnosis not present

## 2023-07-31 DIAGNOSIS — I10 Essential (primary) hypertension: Secondary | ICD-10-CM | POA: Diagnosis not present

## 2023-07-31 DIAGNOSIS — R0600 Dyspnea, unspecified: Secondary | ICD-10-CM | POA: Diagnosis not present

## 2023-07-31 NOTE — Telephone Encounter (Signed)
This encounter was created in error - please disregard.

## 2023-07-31 NOTE — Telephone Encounter (Signed)
PA approved through 01/29/24. Ref# [id number]

## 2023-07-31 NOTE — Telephone Encounter (Signed)
PA started for [facility] 1.7mg  via covermymeds.

## 2023-07-31 NOTE — Telephone Encounter (Signed)
PA started for wegovy 1.7mg 

## 2023-07-31 NOTE — Addendum Note (Signed)
Addended byIva Lento T on: 07/31/2023 09:42 AM   Modules accepted: Orders, Level of Service

## 2023-08-03 ENCOUNTER — Encounter: Payer: Self-pay | Admitting: Physical Medicine & Rehabilitation

## 2023-08-03 ENCOUNTER — Encounter
Payer: BC Managed Care – PPO | Attending: Physical Medicine & Rehabilitation | Admitting: Physical Medicine & Rehabilitation

## 2023-08-03 VITALS — BP 116/80 | HR 82 | Ht 61.0 in | Wt 218.4 lb

## 2023-08-03 DIAGNOSIS — M1612 Unilateral primary osteoarthritis, left hip: Secondary | ICD-10-CM | POA: Diagnosis not present

## 2023-08-03 NOTE — Progress Notes (Signed)
Subjective:    Patient ID: Sherry Martinez, female    DOB: 03-11-1961, 62 y.o.   MRN: [id number]  HPI Left hip osteoarthritis, has been on weight loss program because she was told her BMI had to be around 40 before surgery would be performed.  She is within 7 pounds of her goal now.  She will be getting her surgery performed by Dr. Turner Daniels.  Last hip injection was performed about 6 months ago lasted for a few weeks.  Still taking tramadol 50 mg twice a day no falls, no new injuries.  Pain Inventory Average Pain 9 Pain Right Now 9 My pain is sharp, stabbing, and aching  In the last 24 hours, has pain interfered with the following? General activity 5 Relation with others 0 Enjoyment of life 3 What TIME of day is your pain at its worst? evening and night Sleep (in general) Poor  Pain is worse with: walking, bending, sitting, inactivity, and standing Pain improves with:  nothing  waiting on hip surgeryt Relief from Meds: 4  Family History  Problem Relation Age of Onset   Cancer Mother    Thyroid disease Mother    Hyperlipidemia Father    Hypertension Father    CAD Father    Heart disease Father    Social History   Socioeconomic History   Marital status: Single    Spouse name: Not on file   Number of children: Not on file   Years of education: Not on file   Highest education level: Not on file  Occupational History   Occupation: Print production planner  Tobacco Use   Smoking status: Never   Smokeless tobacco: Not on file  Vaping Use   Vaping status: Never Used  Substance and Sexual Activity   Alcohol use: Yes    Comment: social   Drug use: No   Sexual activity: Not on file  Other Topics Concern   Not on file  Social History Narrative   Not on file   Social Determinants of Health   Financial Resource Strain: Not on file  Food Insecurity: Not on file  Transportation Needs: Not on file  Physical Activity: Not on file  Stress: Not on file  Social Connections: Not on  file   Past Surgical History:  Procedure Laterality Date   CHOLECYSTECTOMY     CYST EXCISION  2000   synovial cyst removed   Past Surgical History:  Procedure Laterality Date   CHOLECYSTECTOMY     CYST EXCISION  2000   synovial cyst removed   Past Medical History:  Diagnosis Date   Back pain    High cholesterol    Hypertension    Joint pain    Thyroid disease    hypothyroid   BP 116/80   Pulse 82   Ht 5\' 1"  (1.549 m)   Wt 218 lb 6.4 oz (99.1 kg)   LMP 04/29/2014   SpO2 97%   BMI 41.27 kg/m   Opioid Risk Score:   Fall Risk Score:  `1  Depression screen [facility] 2/9     08/03/2023   10:08 AM 10/27/2022    2:04 PM 05/25/2022    9:55 AM  Depression screen PHQ 2/9  Decreased Interest 3 0 0  Down, Depressed, Hopeless 3 0 0  PHQ - 2 Score 6 0 0  Altered sleeping   1  Tired, decreased energy   1  Change in appetite   0  Feeling bad or failure about  yourself    0  Trouble concentrating   0  Moving slowly or fidgety/restless   0  Suicidal thoughts   0  PHQ-9 Score   2  Difficult doing work/chores   Not difficult at all     Review of Systems  Constitutional: Negative.   HENT: Negative.    Eyes: Negative.   Respiratory: Negative.    Cardiovascular: Negative.   Gastrointestinal: Negative.   Endocrine: Negative.   Genitourinary: Negative.   Musculoskeletal:  Positive for gait problem.       Left hip pain  Skin: Negative.   Allergic/Immunologic: Negative.   Hematological: Negative.   Psychiatric/Behavioral:  Positive for dysphoric mood.   All other systems reviewed and are negative.      Objective:   Physical Exam  General No acute distress Mood and affect appropriate Extremities without edema Motor strength is 5/5 bilateral knee extensor ankle dorsiflexor but limited hip flexion left greater than right due to pain Hip range of motion absent internal rotation bilaterally external rotation is normal on the right and reduced on the left No evidence of knee  effusion Ambulates with walker no evidence of toe drag or knee instability She has limited hip extension has a forward flexed posture.      Assessment & Plan:   1.  End-stage OA left hip with severe chronic pain as well as joint contracture.  Anticipating left hip replacement in the next month or so. Continue tramadol 50 mg twice daily.  We discussed that after her surgery her surgeon will prescribe stronger pain medicine for about a week and then she can return resume tramadol if needed. She will call me back if she needs some continuing pain management after she recovers from her hip surgery.

## 2023-08-06 ENCOUNTER — Other Ambulatory Visit (INDEPENDENT_AMBULATORY_CARE_PROVIDER_SITE_OTHER): Payer: Self-pay

## 2023-08-06 ENCOUNTER — Encounter (INDEPENDENT_AMBULATORY_CARE_PROVIDER_SITE_OTHER): Payer: Self-pay | Admitting: Family Medicine

## 2023-08-06 DIAGNOSIS — R110 Nausea: Secondary | ICD-10-CM

## 2023-08-06 MED ORDER — ONDANSETRON HCL 4 MG PO TABS
4.0000 mg | ORAL_TABLET | Freq: Three times a day (TID) | ORAL | 0 refills | Status: DC | PRN
Start: 2023-08-06 — End: 2023-08-22

## 2023-08-22 ENCOUNTER — Telehealth (INDEPENDENT_AMBULATORY_CARE_PROVIDER_SITE_OTHER): Payer: BC Managed Care – PPO | Admitting: Family Medicine

## 2023-08-22 ENCOUNTER — Encounter (INDEPENDENT_AMBULATORY_CARE_PROVIDER_SITE_OTHER): Payer: Self-pay | Admitting: Family Medicine

## 2023-08-22 DIAGNOSIS — R110 Nausea: Secondary | ICD-10-CM

## 2023-08-22 DIAGNOSIS — Z6841 Body Mass Index (BMI) 40.0 and over, adult: Secondary | ICD-10-CM | POA: Diagnosis not present

## 2023-08-22 DIAGNOSIS — E669 Obesity, unspecified: Secondary | ICD-10-CM | POA: Diagnosis not present

## 2023-08-22 MED ORDER — ONDANSETRON HCL 4 MG PO TABS
4.0000 mg | ORAL_TABLET | Freq: Three times a day (TID) | ORAL | 0 refills | Status: DC | PRN
Start: 2023-08-22 — End: 2024-07-01

## 2023-08-22 NOTE — Progress Notes (Signed)
TeleHealth Visit:  Due to the COVID-19 pandemic, this visit was completed with telemedicine (audio/video) technology to reduce patient and provider exposure as well as to preserve personal protective equipment.   [name] has verbally consented to this TeleHealth visit. The patient is located at home, the provider is located at the [facility] office. The participants in this visit include the listed provider and patient. The visit was conducted today via MyChart video.   Chief Complaint: OBESITY Sherry Martinez is here to discuss her progress with her obesity treatment plan along with follow-up of her obesity related diagnoses. Sherry Martinez is on keeping a food journal and adhering to recommended goals of 1450-1550 calories and 90+ grams of protein and states she is following her eating plan approximately 100% of the time. Sherry Martinez states she is doing 0 minutes 0 times per week.  Today's visit was #: 13 Starting weight: 254 lbs Starting date: 09/26/2022  Interim History: Patient has a surgery date scheduled for November 13- getting it done at [facility]. She reports weight of 210lbs on her scale at home- which is normally higher than what it is in this office.  Foodwise since last appointment she has staying close to 1450 calories a day and getting 95g of protein a day. No medication changes since last appointment.   Subjective:   1. Nausea Patient is on Zofran with good control of her symptoms.  No side effects of headache or constipation were mentioned.  Assessment/Plan:   1. Nausea We will refill Zofran 4 mg every 8 hours as needed for nausea and vomiting for 1 month.  We will follow-up on patient's management of symptoms at her next appointment.  - ondansetron (ZOFRAN) 4 MG tablet; Take 1 tablet (4 mg total) by mouth every 8 (eight) hours as needed for nausea or vomiting.  Dispense: 30 tablet; Refill: 0  2. BMI 40.0-44.9, adult (HCC)  3. Obesity with starting BMI  of 48.0 Sherry Martinez is currently in the action stage of change. As such, her goal is to continue with weight loss efforts. She has agreed to keeping a food journal and adhering to recommended goals of 1450-1550 calories and 90+ grams of protein daily.   Exercise goals: All adults should avoid inactivity. Some physical activity is better than none, and adults who participate in any amount of physical activity gain some health benefits.  Physical therapy postop.  Behavioral modification strategies: increasing lean protein intake, meal planning and cooking strategies, keeping healthy foods in the home, and planning for success.  Sherry Martinez has agreed to follow-up with our clinic in 7 weeks. She was informed of the importance of frequent follow-up visits to maximize her success with intensive lifestyle modifications for her multiple health conditions.  Objective:   VITALS: Per patient if applicable, see vitals. GENERAL: Alert and in no acute distress. CARDIOPULMONARY: No increased WOB. Speaking in clear sentences.  PSYCH: Pleasant and cooperative. Speech normal rate and rhythm. Affect is appropriate. Insight and judgement are appropriate. Attention is focused, linear, and appropriate.  NEURO: Oriented as arrived to appointment on time with no prompting.   Lab Results  Component Value Date   CREATININE 1.28 (H) 02/02/2023   BUN 34 (H) 02/02/2023   NA 143 02/02/2023   K 4.0 02/02/2023   CL 100 02/02/2023   CO2 23 02/02/2023   Lab Results  Component Value Date   ALT 17 09/26/2022   AST 20 09/26/2022   ALKPHOS 103 09/26/2022   BILITOT 0.3  09/26/2022   Lab Results  Component Value Date   HGBA1C 5.3 09/26/2022   Lab Results  Component Value Date   INSULIN 19.2 09/26/2022   Lab Results  Component Value Date   TSH 0.539 09/26/2022   Lab Results  Component Value Date   CHOL 198 09/26/2022   HDL 76 09/26/2022   LDLCALC 97 09/26/2022   TRIG 144 09/26/2022   Lab Results  Component Value  Date   VD25OH 10.5 (L) 09/26/2022   Lab Results  Component Value Date   WBC 6.7 09/26/2022   HGB 14.2 09/26/2022   HCT 42.2 09/26/2022   MCV 97 09/26/2022   PLT 388 09/26/2022   No results found for: "IRON", "TIBC", "FERRITIN"  Attestation Statements:   Reviewed by clinician on day of visit: allergies, medications, problem list, medical history, surgical history, family history, social history, and previous encounter notes.   I, Burt Knack, am acting as transcriptionist for Reuben Likes, MD. I have reviewed the above documentation for accuracy and completeness, and I agree with the above. - Reuben Likes, MD

## 2023-08-29 DIAGNOSIS — M25552 Pain in left hip: Secondary | ICD-10-CM | POA: Diagnosis not present

## 2023-08-29 DIAGNOSIS — M25652 Stiffness of left hip, not elsewhere classified: Secondary | ICD-10-CM | POA: Diagnosis not present

## 2023-08-29 DIAGNOSIS — M1612 Unilateral primary osteoarthritis, left hip: Secondary | ICD-10-CM | POA: Diagnosis not present

## 2023-08-30 DIAGNOSIS — M1612 Unilateral primary osteoarthritis, left hip: Secondary | ICD-10-CM | POA: Diagnosis not present

## 2023-08-30 DIAGNOSIS — M25652 Stiffness of left hip, not elsewhere classified: Secondary | ICD-10-CM | POA: Diagnosis not present

## 2023-08-30 DIAGNOSIS — R262 Difficulty in walking, not elsewhere classified: Secondary | ICD-10-CM | POA: Diagnosis not present

## 2023-09-12 DIAGNOSIS — M25752 Osteophyte, left hip: Secondary | ICD-10-CM | POA: Diagnosis not present

## 2023-09-12 DIAGNOSIS — M71352 Other bursal cyst, left hip: Secondary | ICD-10-CM | POA: Diagnosis not present

## 2023-09-12 DIAGNOSIS — M1612 Unilateral primary osteoarthritis, left hip: Secondary | ICD-10-CM | POA: Diagnosis not present

## 2023-09-16 ENCOUNTER — Other Ambulatory Visit (INDEPENDENT_AMBULATORY_CARE_PROVIDER_SITE_OTHER): Payer: Self-pay | Admitting: Family Medicine

## 2023-09-16 DIAGNOSIS — I10 Essential (primary) hypertension: Secondary | ICD-10-CM

## 2023-09-25 DIAGNOSIS — M25652 Stiffness of left hip, not elsewhere classified: Secondary | ICD-10-CM | POA: Diagnosis not present

## 2023-10-04 ENCOUNTER — Encounter (INDEPENDENT_AMBULATORY_CARE_PROVIDER_SITE_OTHER): Payer: Self-pay | Admitting: Family Medicine

## 2023-10-04 ENCOUNTER — Telehealth (INDEPENDENT_AMBULATORY_CARE_PROVIDER_SITE_OTHER): Payer: BC Managed Care – PPO | Admitting: Family Medicine

## 2023-10-04 DIAGNOSIS — M1612 Unilateral primary osteoarthritis, left hip: Secondary | ICD-10-CM | POA: Diagnosis not present

## 2023-10-04 DIAGNOSIS — Z6841 Body Mass Index (BMI) 40.0 and over, adult: Secondary | ICD-10-CM

## 2023-10-04 DIAGNOSIS — E039 Hypothyroidism, unspecified: Secondary | ICD-10-CM

## 2023-10-04 DIAGNOSIS — E669 Obesity, unspecified: Secondary | ICD-10-CM | POA: Diagnosis not present

## 2023-10-04 MED ORDER — WEGOVY 1.7 MG/0.75ML ~~LOC~~ SOAJ
1.7000 mg | SUBCUTANEOUS | 0 refills | Status: DC
Start: 2023-10-04 — End: 2023-11-07

## 2023-10-04 MED ORDER — LEVOTHYROXINE SODIUM 125 MCG PO TABS
125.0000 ug | ORAL_TABLET | Freq: Every day | ORAL | 0 refills | Status: AC
Start: 2023-10-04 — End: ?

## 2023-10-04 NOTE — Assessment & Plan Note (Signed)
Patient underwent hip replacement recently and had significant improvement in mobility afterwards.  She is still doing her exercises post operatively.

## 2023-10-04 NOTE — Progress Notes (Signed)
   SUBJECTIVE:  Chief Complaint: Obesity  Interim History: Patient had a good Thanksgiving.  Since last appointment she got her hip surgery done and had a speedy recovery.  She drove on day two and was back at work after a week.  She is taking in 1600-1700 calories and getting in only about 80g of protein a day.  She also is not getting in enough water.  Over the next month she is getting ready for Christmas and planning on working. She wants to focus on the protein over the next few weeks. Plan is to start Wegovy again this Saturday.   Sherry Martinez is here to discuss her progress with her obesity treatment plan. She is on the keeping a food journal and adhering to recommended goals of 1400-1500 calories and 80 grams of protein and states she is following her eating plan approximately 50 % of the time. She states she is not exercising.   OBJECTIVE: Visit Diagnoses: Problem List Items Addressed This Visit       Endocrine   Hypothyroidism    On levothyroxine daily.  She denies cold intolerance, heat intolerance and palpitations.  She needs refill of levothyroxine today.  She is seeing her PCP in the next month.  Labs per PCP      Relevant Medications   levothyroxine (SYNTHROID) 125 MCG tablet     Musculoskeletal and Integument   Unilateral primary osteoarthritis, left hip - Primary    Patient underwent hip replacement recently and had significant improvement in mobility afterwards.  She is still doing her exercises post operatively.      Other Visit Diagnoses     Obesity with starting BMI of 48.0       Relevant Medications   Semaglutide-Weight Management (WEGOVY) 1.7 MG/0.75ML SOAJ   BMI 40.0-44.9, adult (HCC)       Relevant Medications   Semaglutide-Weight Management (WEGOVY) 1.7 MG/0.75ML SOAJ       No data recorded No data recorded No data recorded Other Clinical Data Starting Date: 09/26/22     ASSESSMENT AND PLAN:  Diet: Sherry Martinez is currently in the action stage of  change. As such, her goal is to continue with weight loss efforts. She has agreed to Category 3 Plan.  Exercise: Sherry Martinez has been instructed that some exercise is better than none for weight loss and overall health benefits.   Behavior Modification:  We discussed the following Behavioral Modification Strategies today: increasing lean protein intake, increasing vegetables, meal planning and cooking strategies, and keep a strict food journal. We discussed various medication options to help Sherry Martinez with her weight loss efforts and we both agreed to restart Wegovy in 2 days at dose she was taking prior to her surgery.  No follow-ups on file.Marland Kitchen She was informed of the importance of frequent follow up visits to maximize her success with intensive lifestyle modifications for her multiple health conditions.  Attestation Statements:   Reviewed by clinician on day of visit: allergies, medications, problem list, medical history, surgical history, family history, social history, and previous encounter notes.    Reuben Likes, MD

## 2023-10-04 NOTE — Assessment & Plan Note (Signed)
On levothyroxine daily.  She denies cold intolerance, heat intolerance and palpitations.  She needs refill of levothyroxine today.  She is seeing her PCP in the next month.  Labs per PCP

## 2023-10-26 DIAGNOSIS — R7989 Other specified abnormal findings of blood chemistry: Secondary | ICD-10-CM | POA: Diagnosis not present

## 2023-10-26 DIAGNOSIS — R946 Abnormal results of thyroid function studies: Secondary | ICD-10-CM | POA: Diagnosis not present

## 2023-11-07 ENCOUNTER — Ambulatory Visit (INDEPENDENT_AMBULATORY_CARE_PROVIDER_SITE_OTHER): Payer: BC Managed Care – PPO | Admitting: Family Medicine

## 2023-11-07 VITALS — BP 116/87 | HR 67 | Temp 97.9°F | Ht 61.0 in | Wt 216.0 lb

## 2023-11-07 DIAGNOSIS — E039 Hypothyroidism, unspecified: Secondary | ICD-10-CM | POA: Diagnosis not present

## 2023-11-07 DIAGNOSIS — E669 Obesity, unspecified: Secondary | ICD-10-CM

## 2023-11-07 DIAGNOSIS — I10 Essential (primary) hypertension: Secondary | ICD-10-CM

## 2023-11-07 DIAGNOSIS — Z6841 Body Mass Index (BMI) 40.0 and over, adult: Secondary | ICD-10-CM

## 2023-11-07 MED ORDER — WEGOVY 1.7 MG/0.75ML ~~LOC~~ SOAJ: 1.7000 mg | SUBCUTANEOUS | 0 refills | Status: DC

## 2023-11-07 NOTE — Progress Notes (Signed)
 SUBJECTIVE:  Chief Complaint: Obesity  Interim History: Patient had a good holiday season.  Sherry Martinez feels somewhat relieved that the season is over. Sherry Martinez is sticking to the plan easily getting around 1400 calories in and about 85 grams of protein daily.  Sherry Martinez is no hungry. Her hip is completely heal currently.  Sherry Martinez is seeing her ortho tomorrow for evaluation of her right hip.   Sherry Martinez is here to discuss her progress with her obesity treatment plan. Sherry Martinez is on the keeping a food journal and adhering to recommended goals of 1400 calories and 85 grams of protein and states Sherry Martinez is following her eating plan approximately 95% of the time. Sherry Martinez states Sherry Martinez is walking 1/2 mile 3 days a week.   OBJECTIVE: Visit Diagnoses: Problem List Items Addressed This Visit       Cardiovascular and Mediastinum   HTN (hypertension)   Patient's blood pressure is very well controlled today.  Her last CMP showed a slightly elevated but stable creatinine level. Sherry Martinez was encouraged to get a repeat metabolic panel with her PCP in the next few weeks to ensure her kidney function has not changed given her weight loss and continuation of her blood pressure medication.        Endocrine   Hypothyroidism - Primary   Patient on synthroid  with a decrease from 137 to 125mcg after last labs.  TSH done at end of December was 14.5.  PCP to retest TSH in the next month to ensure patient level back to WNL.  Will hold on refill until next TSH.      Other Visit Diagnoses       Obesity with starting BMI of 48.0       Relevant Medications   Semaglutide -Weight Management (WEGOVY ) 1.7 MG/0.75ML SOAJ       Vitals Temp: 97.9 F (36.6 C) BP: 116/87 Pulse Rate: 67 SpO2: 98 %   Anthropometric Measurements Height: 5' 1 (1.549 m) Weight: 216 lb (98 kg) BMI (Calculated): 40.83 Weight at Last Visit: 225 lb Weight Lost Since Last Visit: 0 Weight Gained Since Last Visit: 9 lb Starting Weight: 254 lb Total Weight Loss (lbs): 38 lb  (17.2 kg)   No data recorded Other Clinical Data Today's Visit #: 15 Starting Date: 09/26/22     ASSESSMENT AND PLAN:  Diet: Sherry Martinez is currently in the action stage of change. As such, her goal is to continue with weight loss efforts. Sherry Martinez has agreed to keeping a food journal and adhering to recommended goals of 1400 calories and 85 or more grams of protein daily.  Exercise: Sherry Martinez has been instructed that some exercise is better than none for weight loss and overall health benefits.   Behavior Modification:  We discussed the following Behavioral Modification Strategies today: increasing lean protein intake, increasing vegetables, no skipping meals, better snacking choices, and keep a strict food journal. We discussed various medication options to help Sherry Martinez with her weight loss efforts and we both agreed to continue wegovy  at current dose- comfortable dose where patient is able to get all food in and not experiencing any side effects or hunger.  No follow-ups on file.Sherry Martinez Sherry Martinez was informed of the importance of frequent follow up visits to maximize her success with intensive lifestyle modifications for her multiple health conditions.  Attestation Statements:   Reviewed by clinician on day of visit: allergies, medications, problem list, medical history, surgical history, family history, social history, and previous encounter notes.      Sherry Cho,  MD

## 2023-11-07 NOTE — Assessment & Plan Note (Signed)
 Patient on synthroid with a decrease from 137 to after last labs.  TSH done at end of December was 14.5.  PCP to retest TSH in the next month to ensure patient level back to WNL.  Will hold on refill until next TSH.

## 2023-11-07 NOTE — Assessment & Plan Note (Signed)
 Patient's blood pressure is very well controlled today.  Her last CMP showed a slightly elevated but stable creatinine level. She was encouraged to get a repeat metabolic panel with her PCP in the next few weeks to ensure her kidney function has not changed given her weight loss and continuation of her blood pressure medication.

## 2023-11-08 DIAGNOSIS — M25551 Pain in right hip: Secondary | ICD-10-CM | POA: Diagnosis not present

## 2023-11-13 DIAGNOSIS — M6281 Muscle weakness (generalized): Secondary | ICD-10-CM | POA: Diagnosis not present

## 2023-11-13 DIAGNOSIS — S76311D Strain of muscle, fascia and tendon of the posterior muscle group at thigh level, right thigh, subsequent encounter: Secondary | ICD-10-CM | POA: Diagnosis not present

## 2023-11-26 DIAGNOSIS — E039 Hypothyroidism, unspecified: Secondary | ICD-10-CM | POA: Diagnosis not present

## 2023-11-26 DIAGNOSIS — E559 Vitamin D deficiency, unspecified: Secondary | ICD-10-CM | POA: Diagnosis not present

## 2023-12-16 ENCOUNTER — Encounter (INDEPENDENT_AMBULATORY_CARE_PROVIDER_SITE_OTHER): Payer: Self-pay | Admitting: Family Medicine

## 2024-01-02 ENCOUNTER — Ambulatory Visit (INDEPENDENT_AMBULATORY_CARE_PROVIDER_SITE_OTHER): Payer: BC Managed Care – PPO | Admitting: Family Medicine

## 2024-01-08 ENCOUNTER — Encounter (INDEPENDENT_AMBULATORY_CARE_PROVIDER_SITE_OTHER): Payer: Self-pay | Admitting: Family Medicine

## 2024-01-15 ENCOUNTER — Encounter (INDEPENDENT_AMBULATORY_CARE_PROVIDER_SITE_OTHER): Payer: Self-pay | Admitting: Family Medicine

## 2024-01-15 ENCOUNTER — Ambulatory Visit (INDEPENDENT_AMBULATORY_CARE_PROVIDER_SITE_OTHER): Admitting: Family Medicine

## 2024-01-15 VITALS — BP 130/83 | HR 62 | Temp 98.1°F | Ht 61.0 in | Wt 207.0 lb

## 2024-01-15 DIAGNOSIS — Z6839 Body mass index (BMI) 39.0-39.9, adult: Secondary | ICD-10-CM | POA: Diagnosis not present

## 2024-01-15 DIAGNOSIS — I10 Essential (primary) hypertension: Secondary | ICD-10-CM

## 2024-01-15 MED ORDER — WEGOVY 1.7 MG/0.75ML ~~LOC~~ SOAJ: 1.7000 mg | SUBCUTANEOUS | 0 refills | Status: DC

## 2024-01-15 NOTE — Assessment & Plan Note (Signed)
 Blood pressure well controlled today.  She is on toprol and maxzide with no side effects.  BP not at range to decrease medications yet.  Follow up BP at next appointment.

## 2024-01-15 NOTE — Progress Notes (Unsigned)
   SUBJECTIVE:  Chief Complaint: Obesity  Interim History: Patient has been working most of the time since the last appointment.  She is sticking with 1400 calories daily and getting around 85 or more grams of protein daily.  Her hip pain is gone and she has been walking and doing quite a bit of activity in the form of resistance training.  She is going to try to plan a trip to [place] in October with her son.  Sherry Martinez is here to discuss her progress with her obesity treatment plan. She is on the keeping a food journal and adhering to recommended goals of 1400 calories and 85 grams of protein and states she is following her eating plan approximately 100 % of the time. She states she is walking 20 minutes 3 times per week.   OBJECTIVE: Visit Diagnoses: Problem List Items Addressed This Visit   None   Vitals Temp: 98.1 F (36.7 C) BP: 130/83 Pulse Rate: 62 SpO2: 98 %   Anthropometric Measurements Height: 5\' 1"  (1.549 m) Weight: 207 lb (93.9 kg) BMI (Calculated): 39.13 Weight at Last Visit: 216 lb Weight Lost Since Last Visit: 9 Weight Gained Since Last Visit: 0 Starting Weight: 254 lb Total Weight Loss (lbs): 47 lb (21.3 kg)   Body Composition  Body Fat %: 40.6 % Fat Mass (lbs): 84.2 lbs Muscle Mass (lbs): 117 lbs Total Body Water (lbs): 90.6 lbs Visceral Fat Rating : 13   Other Clinical Data Today's Visit #: 16 Starting Date: 09/26/22 Comments: 1400/85     ASSESSMENT AND PLAN:  Diet: Sherry Martinez is currently in the action stage of change. As such, her goal is to continue with weight loss efforts and has agreed to keeping a food journal and adhering to recommended goals of 1400 calories and 85 ore more grams of protein daily.   Exercise:  Adults should also include muscle-strengthening activities that involve all major muscle groups on 2 or more days a week.  Behavior Modification:  We discussed the following Behavioral Modification Strategies today:  increasing lean protein intake, meal planning and cooking strategies, keeping healthy foods in the home, avoiding temptations, planning for success, and keep a strict food journal. We discussed various medication options to help Sherry Martinez with her weight loss efforts and we both agreed to continue wegovy at current dose.  No follow-ups on file.Marland Kitchen She was informed of the importance of frequent follow up visits to maximize her success with intensive lifestyle modifications for her multiple health conditions.  Attestation Statements:   Reviewed by clinician on day of visit: allergies, medications, problem list, medical history, surgical history, family history, social history, and previous encounter notes.  Reuben Likes, MD

## 2024-01-18 NOTE — Assessment & Plan Note (Signed)
 Anthropometric Measurements Height: 5\' 1"  (1.549 m) Weight: 207 lb (93.9 kg) BMI (Calculated): 39.13 Weight at Last Visit: 216 lb Weight Lost Since Last Visit: 9 Weight Gained Since Last Visit: 0 Starting Weight: 254 lb Total Weight Loss (lbs): 47 lb (21.3 kg) Body Composition  Body Fat %: 40.6 % Fat Mass (lbs): 84.2 lbs Muscle Mass (lbs): 117 lbs Total Body Water (lbs): 90.6 lbs Visceral Fat Other Clinical Data Today's Visit #: 16 Starting Date: 09/26/22 Comments: 1400/85 Rating : 13

## 2024-02-04 ENCOUNTER — Ambulatory Visit (INDEPENDENT_AMBULATORY_CARE_PROVIDER_SITE_OTHER): Admitting: Family Medicine

## 2024-02-05 ENCOUNTER — Encounter (INDEPENDENT_AMBULATORY_CARE_PROVIDER_SITE_OTHER): Payer: Self-pay

## 2024-02-14 DIAGNOSIS — M76891 Other specified enthesopathies of right lower limb, excluding foot: Secondary | ICD-10-CM | POA: Diagnosis not present

## 2024-04-01 ENCOUNTER — Other Ambulatory Visit: Payer: Self-pay | Admitting: Internal Medicine

## 2024-04-01 DIAGNOSIS — Z1231 Encounter for screening mammogram for malignant neoplasm of breast: Secondary | ICD-10-CM

## 2024-04-01 DIAGNOSIS — G4733 Obstructive sleep apnea (adult) (pediatric): Secondary | ICD-10-CM | POA: Diagnosis not present

## 2024-04-01 DIAGNOSIS — E039 Hypothyroidism, unspecified: Secondary | ICD-10-CM | POA: Diagnosis not present

## 2024-04-01 DIAGNOSIS — Z0000 Encounter for general adult medical examination without abnormal findings: Secondary | ICD-10-CM | POA: Diagnosis not present

## 2024-04-01 DIAGNOSIS — I10 Essential (primary) hypertension: Secondary | ICD-10-CM | POA: Diagnosis not present

## 2024-04-01 DIAGNOSIS — E785 Hyperlipidemia, unspecified: Secondary | ICD-10-CM | POA: Diagnosis not present

## 2024-04-02 ENCOUNTER — Ambulatory Visit
Admission: RE | Admit: 2024-04-02 | Discharge: 2024-04-02 | Disposition: A | Discharge status: Home / Self Care | Source: Ambulatory Visit | Attending: Internal Medicine | Admitting: Internal Medicine

## 2024-04-02 DIAGNOSIS — Z01419 Encounter for gynecological examination (general) (routine) without abnormal findings: Secondary | ICD-10-CM | POA: Diagnosis not present

## 2024-04-02 DIAGNOSIS — Z124 Encounter for screening for malignant neoplasm of cervix: Secondary | ICD-10-CM | POA: Diagnosis not present

## 2024-04-02 DIAGNOSIS — Z1231 Encounter for screening mammogram for malignant neoplasm of breast: Secondary | ICD-10-CM | POA: Diagnosis not present

## 2024-04-11 DIAGNOSIS — M8588 Other specified disorders of bone density and structure, other site: Secondary | ICD-10-CM | POA: Diagnosis not present

## 2024-04-11 DIAGNOSIS — E2839 Other primary ovarian failure: Secondary | ICD-10-CM | POA: Diagnosis not present

## 2024-04-11 DIAGNOSIS — N958 Other specified menopausal and perimenopausal disorders: Secondary | ICD-10-CM | POA: Diagnosis not present

## 2024-04-15 ENCOUNTER — Encounter (INDEPENDENT_AMBULATORY_CARE_PROVIDER_SITE_OTHER): Payer: Self-pay | Admitting: Family Medicine

## 2024-04-15 ENCOUNTER — Ambulatory Visit (INDEPENDENT_AMBULATORY_CARE_PROVIDER_SITE_OTHER): Admitting: Family Medicine

## 2024-04-15 VITALS — BP 125/87 | HR 72 | Temp 98.2°F | Ht 61.0 in | Wt 201.0 lb

## 2024-04-15 DIAGNOSIS — Z6838 Body mass index (BMI) 38.0-38.9, adult: Secondary | ICD-10-CM

## 2024-04-15 DIAGNOSIS — E781 Pure hyperglyceridemia: Secondary | ICD-10-CM | POA: Diagnosis not present

## 2024-04-15 DIAGNOSIS — Z6837 Body mass index (BMI) 37.0-37.9, adult: Secondary | ICD-10-CM

## 2024-04-15 DIAGNOSIS — I10 Essential (primary) hypertension: Secondary | ICD-10-CM | POA: Diagnosis not present

## 2024-04-15 DIAGNOSIS — E669 Obesity, unspecified: Secondary | ICD-10-CM | POA: Diagnosis not present

## 2024-04-15 MED ORDER — WEGOVY 1.7 MG/0.75ML ~~LOC~~ SOAJ: 1.7000 mg | SUBCUTANEOUS | 0 refills | Status: DC

## 2024-04-15 NOTE — Assessment & Plan Note (Signed)
 Reviewed labs from patient's PCP today- triglycerides elevated into 230s.  Discussed where cholesterol can be found in foods and importance of limiting carbohydrate fat laden foods.  Repeat labs in 6 months to ensure improvement.

## 2024-04-15 NOTE — Progress Notes (Signed)
 SUBJECTIVE:  Chief Complaint: Obesity  Interim History: patient voices she fell back into not eating much- getting 1100-1200 calories and getting around 80 grams of protein for the day.  She mentions she is cooking more as she has the ability to get up and move around much easier since her joint replacement. No upcoming plans for events or activities.  She is walking 30 minutes and feels like she can be consistent with this that daily. She is walking outside.  Sherry Martinez is here to discuss her progress with her obesity treatment plan. She is on the keeping a food journal and adhering to recommended goals of 1400 calories and 85 grams of protein and states she is following her eating plan approximately 70 % of the time. She states she is walking 30 minutes 5 days per week.   OBJECTIVE: Visit Diagnoses: Problem List Items Addressed This Visit       Cardiovascular and Mediastinum   HTN (hypertension) - Primary   Blood pressure well controlled today- no chest pain, chest pressure or headache.  BP at goal- will not alter medication dosage today.  Follow up on BP at next appointment.        Other   Hypertriglyceridemia   Reviewed labs from patient's PCP today- triglycerides elevated into 230s.  Discussed where cholesterol can be found in foods and importance of limiting carbohydrate fat laden foods.  Repeat labs in 6 months to ensure improvement.      Other Visit Diagnoses       Obesity with starting BMI of 48.0       Relevant Medications   Semaglutide -Weight Management (WEGOVY ) 1.7 MG/0.75ML SOAJ       Vitals Temp: 98.2 F (36.8 C) BP: 125/87 Pulse Rate: 72 SpO2: 99 %   Anthropometric Measurements Height: 5' 1 (1.549 m) Weight: 201 lb (91.2 kg) BMI (Calculated): 38 Weight at Last Visit: 207 lb Weight Lost Since Last Visit: 6 lb Weight Gained Since Last Visit: 0 Starting Weight: 254 lb Total Weight Loss (lbs): 53 lb (24 kg)   Body Composition  Body Fat %: 48 % Fat  Mass (lbs): 96.8 lbs Muscle Mass (lbs): 99.4 lbs Total Body Water (lbs): 81 lbs Visceral Fat Rating : 15   Other Clinical Data Fasting: no Labs: no Today's Visit #: 17 Starting Date: 09/26/22 Comments: 1400/85     ASSESSMENT AND PLAN:  Diet: Sherry Martinez is currently in the action stage of change. As such, her goal is to continue with weight loss efforts and has agreed to keeping a food journal and adhering to recommended goals of 1400 calories and 85 or more grams protein daily.   Exercise:  For substantial health benefits, adults should do at least 150 minutes (2 hours and 30 minutes) a week of moderate-intensity, or 75 minutes (1 hour and 15 minutes) a week of vigorous-intensity aerobic physical activity, or an equivalent combination of moderate- and vigorous-intensity aerobic activity. Aerobic activity should be performed in episodes of at least 10 minutes, and preferably, it should be spread throughout the week.  Patient to get a weighted vest to add additional resistance to her walking.  Behavior Modification:  We discussed the following Behavioral Modification Strategies today: increasing lean protein intake, decreasing simple carbohydrates, increasing vegetables, meal planning and cooking strategies, keeping healthy foods in the home, and keep a strict food journal. We discussed various medication options to help Sherry Martinez with her weight loss efforts and we both agreed to continue Wegovy  at current 2.4mg   dose.  Return in about 3 months (around 07/16/2024).   She was informed of the importance of frequent follow up visits to maximize her success with intensive lifestyle modifications for her multiple health conditions.  Attestation Statements:   Reviewed by clinician on day of visit: allergies, medications, problem list, medical history, surgical history, family history, social history, and previous encounter notes.     Donaciano Frizzle, MD

## 2024-04-15 NOTE — Assessment & Plan Note (Signed)
 Blood pressure well controlled today- no chest pain, chest pressure or headache.  BP at goal- will not alter medication dosage today.  Follow up on BP at next appointment.

## 2024-05-20 DIAGNOSIS — M5441 Lumbago with sciatica, right side: Secondary | ICD-10-CM | POA: Diagnosis not present

## 2024-05-20 DIAGNOSIS — M25551 Pain in right hip: Secondary | ICD-10-CM | POA: Diagnosis not present

## 2024-05-21 ENCOUNTER — Other Ambulatory Visit: Payer: Self-pay | Admitting: Orthopedic Surgery

## 2024-05-21 DIAGNOSIS — M5416 Radiculopathy, lumbar region: Secondary | ICD-10-CM

## 2024-06-07 ENCOUNTER — Ambulatory Visit
Admission: RE | Admit: 2024-06-07 | Discharge: 2024-06-07 | Disposition: A | Discharge status: Home / Self Care | Source: Ambulatory Visit | Attending: Orthopedic Surgery | Admitting: Orthopedic Surgery

## 2024-06-07 DIAGNOSIS — M5416 Radiculopathy, lumbar region: Secondary | ICD-10-CM

## 2024-06-16 DIAGNOSIS — M5416 Radiculopathy, lumbar region: Secondary | ICD-10-CM | POA: Diagnosis not present

## 2024-06-27 ENCOUNTER — Encounter (INDEPENDENT_AMBULATORY_CARE_PROVIDER_SITE_OTHER): Payer: Self-pay | Admitting: Family Medicine

## 2024-07-01 ENCOUNTER — Other Ambulatory Visit (INDEPENDENT_AMBULATORY_CARE_PROVIDER_SITE_OTHER): Payer: Self-pay

## 2024-07-01 DIAGNOSIS — R110 Nausea: Secondary | ICD-10-CM

## 2024-07-01 MED ORDER — ONDANSETRON HCL 4 MG PO TABS: 4.0000 mg | ORAL_TABLET | Freq: Three times a day (TID) | ORAL | 0 refills | Status: DC | PRN

## 2024-07-10 ENCOUNTER — Ambulatory Visit (INDEPENDENT_AMBULATORY_CARE_PROVIDER_SITE_OTHER): Admitting: Family Medicine

## 2024-07-10 ENCOUNTER — Encounter (INDEPENDENT_AMBULATORY_CARE_PROVIDER_SITE_OTHER): Payer: Self-pay | Admitting: Family Medicine

## 2024-07-10 VITALS — BP 107/77 | HR 70 | Temp 97.9°F | Ht 61.0 in | Wt 196.0 lb

## 2024-07-10 DIAGNOSIS — E559 Vitamin D deficiency, unspecified: Secondary | ICD-10-CM | POA: Diagnosis not present

## 2024-07-10 DIAGNOSIS — E669 Obesity, unspecified: Secondary | ICD-10-CM | POA: Diagnosis not present

## 2024-07-10 DIAGNOSIS — Z6837 Body mass index (BMI) 37.0-37.9, adult: Secondary | ICD-10-CM | POA: Diagnosis not present

## 2024-07-10 MED ORDER — WEGOVY 1.7 MG/0.75ML ~~LOC~~ SOAJ: 1.7000 mg | SUBCUTANEOUS | 1 refills | Status: DC

## 2024-07-10 NOTE — Progress Notes (Signed)
   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  She has been most consistent around 1400 calories but occasionally slips to 1000 calories a day.   Sherry Martinez is here to discuss her progress with her obesity treatment plan. She is on the keeping a food journal and adhering to recommended goals of 1400 calories and 80 grams of protein and states she is following her eating plan approximately 85 % of the time. She states she is exercising 40 minutes 4 times per week.   OBJECTIVE: Visit Diagnoses: Problem List Items Addressed This Visit   None   Vitals Temp: 97.9 F (36.6 C) BP: 107/77 Pulse Rate: 70 SpO2: 99 %   Anthropometric Measurements Height: 5' 1 (1.549 m) Weight: 196 lb (88.9 kg) BMI (Calculated): 37.05 Weight at Last Visit: 201 lb Weight Lost Since Last Visit: 58 lb Weight Gained Since Last Visit: 0 Starting Weight: 254 lb Total Weight Loss (lbs): 58 lb (26.3 kg)   Body Composition  Body Fat %: 41.7 % Fat Mass (lbs): 82 lbs Muscle Mass (lbs): 109 lbs Total Body Water (lbs): 86.6 lbs Visceral Fat Rating : 13   Other Clinical Data Today's Visit #: 18 Starting Date: 09/26/22 Comments: 1400/80     ASSESSMENT AND PLAN: Assessment & Plan Obesity with starting BMI of 48.0  BMI 37.0-37.9, adult  Vitamin D  deficiency Last documented Vitamin D  level well below normal and goal range.  Patient is on OTC Vitamin D  supplementation at 5k international units daily.  No nausea, vomiting or muscle weakness reported.  Labs with PCP or here at next appointment.     Diet: Cloria is currently in the action stage of change. As such, her goal is to continue with weight loss efforts and has agreed to keeping a food journal and adhering to recommended goals of 1400 calories and 85 or more grams of protein daily.   Exercise:  For substantial health benefits,  adults should do at least 150 minutes (2 hours and 30 minutes) a week of moderate-intensity, or 75 minutes (1 hour and 15 minutes) a week of vigorous-intensity aerobic physical activity, or an equivalent combination of moderate- and vigorous-intensity aerobic activity. Aerobic activity should be performed in episodes of at least 10 minutes, and preferably, it should be spread throughout the week.  Behavior Modification:  We discussed the following Behavioral Modification Strategies today: increasing lean protein intake, decreasing simple carbohydrates, increasing vegetables, meal planning and cooking strategies, keeping healthy foods in the home, planning for success, and keep a strict food journal. We discussed various medication options to help Ruby with her weight loss efforts and we both agreed to continue wegovy  at current dose for another 4 months.  Follow up in 4 months- labs at that apointment.   She was informed of the importance of frequent follow up visits to maximize her success with intensive lifestyle modifications for her multiple health conditions.  Attestation Statements:   Reviewed by clinician on day of visit: allergies, medications, problem list, medical history, surgical history, family history, social history, and previous encounter notes.     [name], MD

## 2024-07-15 ENCOUNTER — Ambulatory Visit (INDEPENDENT_AMBULATORY_CARE_PROVIDER_SITE_OTHER): Admitting: Family Medicine

## 2024-07-15 DIAGNOSIS — M5416 Radiculopathy, lumbar region: Secondary | ICD-10-CM | POA: Diagnosis not present

## 2024-07-22 NOTE — Assessment & Plan Note (Signed)
 Last documented Vitamin D  level well below normal and goal range.  Patient is on OTC Vitamin D  supplementation at 5k international units daily.  No nausea, vomiting or muscle weakness reported.  Labs with PCP or here at next appointment.

## 2024-09-12 ENCOUNTER — Encounter (INDEPENDENT_AMBULATORY_CARE_PROVIDER_SITE_OTHER): Payer: Self-pay | Admitting: Family Medicine

## 2024-09-12 ENCOUNTER — Other Ambulatory Visit (INDEPENDENT_AMBULATORY_CARE_PROVIDER_SITE_OTHER): Payer: Self-pay | Admitting: Family Medicine

## 2024-09-12 DIAGNOSIS — R110 Nausea: Secondary | ICD-10-CM

## 2024-09-15 MED ORDER — ONDANSETRON HCL 4 MG PO TABS: 4.0000 mg | ORAL_TABLET | Freq: Three times a day (TID) | ORAL | 0 refills | Status: DC | PRN

## 2024-10-01 DIAGNOSIS — I10 Essential (primary) hypertension: Secondary | ICD-10-CM | POA: Diagnosis not present

## 2024-10-01 DIAGNOSIS — F3341 Major depressive disorder, recurrent, in partial remission: Secondary | ICD-10-CM | POA: Diagnosis not present

## 2024-10-01 DIAGNOSIS — E039 Hypothyroidism, unspecified: Secondary | ICD-10-CM | POA: Diagnosis not present

## 2024-10-01 DIAGNOSIS — G4733 Obstructive sleep apnea (adult) (pediatric): Secondary | ICD-10-CM | POA: Diagnosis not present

## 2024-10-01 DIAGNOSIS — Z23 Encounter for immunization: Secondary | ICD-10-CM | POA: Diagnosis not present

## 2024-10-10 DIAGNOSIS — M5416 Radiculopathy, lumbar region: Secondary | ICD-10-CM | POA: Diagnosis not present

## 2024-10-14 ENCOUNTER — Other Ambulatory Visit (INDEPENDENT_AMBULATORY_CARE_PROVIDER_SITE_OTHER): Payer: Self-pay | Admitting: Family Medicine

## 2024-10-14 DIAGNOSIS — R110 Nausea: Secondary | ICD-10-CM

## 2024-10-24 ENCOUNTER — Encounter (INDEPENDENT_AMBULATORY_CARE_PROVIDER_SITE_OTHER): Payer: Self-pay | Admitting: Family Medicine

## 2024-10-27 NOTE — Telephone Encounter (Signed)
 Good morning!   [facility] called with the patient on the line inquiring about this. The asked for a case number and stated that the number would start with the letters PA.   [facility]'s contact info is [phone] and they request that someone call to make sure the PA was submitted properly.

## 2024-10-28 DIAGNOSIS — M5416 Radiculopathy, lumbar region: Secondary | ICD-10-CM | POA: Diagnosis not present

## 2024-11-06 ENCOUNTER — Ambulatory Visit (INDEPENDENT_AMBULATORY_CARE_PROVIDER_SITE_OTHER): Payer: Self-pay | Admitting: Family Medicine

## 2024-11-11 ENCOUNTER — Telehealth (INDEPENDENT_AMBULATORY_CARE_PROVIDER_SITE_OTHER): Payer: Self-pay | Admitting: Family Medicine

## 2024-11-11 NOTE — Telephone Encounter (Signed)
 LAST APPOINTMENT DATE: 07/10/24, 4 month f/u NEXT APPOINTMENT DATE: was scheduled 11/13/23, Dr RAYMOND ABRAHAM for emergency   [id number] [place], [place] - [address] [place] [address] Phone: [phone] Fax: [phone]  [facility] - [place], [place] - [address] [place] [address] Phone: [phone] Fax: [phone]  Patient is requesting a refill of the following medications: Requested Prescriptions    No prescriptions requested or ordered in this encounter    Date last filled: 07/10/24 Previously prescribed by Dr Berkeley  Lab Results  Component Value Date   HGBA1C 5.3 09/26/2022   Lab Results  Component Value Date   LDLCALC 97 09/26/2022   CREATININE 1.28 (H) 02/02/2023   Lab Results  Component Value Date   VD25OH 10.5 (L) 09/26/2022    BP Readings from Last 3 Encounters:  07/10/24 107/77  04/15/24 125/87  01/15/24 130/83

## 2024-11-11 NOTE — Telephone Encounter (Signed)
 Called pt to r/s due to provider out of office and she is needing a refill on her Zofran  [facility] sent to [facility] [address]   She is r/s to 2/5. She said to tell Dr Berkeley she is down to 185.

## 2024-11-12 ENCOUNTER — Other Ambulatory Visit (INDEPENDENT_AMBULATORY_CARE_PROVIDER_SITE_OTHER): Payer: Self-pay | Admitting: Nurse Practitioner

## 2024-11-12 ENCOUNTER — Ambulatory Visit (INDEPENDENT_AMBULATORY_CARE_PROVIDER_SITE_OTHER): Admitting: Family Medicine

## 2024-11-12 DIAGNOSIS — R110 Nausea: Secondary | ICD-10-CM

## 2024-11-12 MED ORDER — ONDANSETRON HCL 4 MG PO TABS: 4.0000 mg | ORAL_TABLET | Freq: Three times a day (TID) | ORAL | 0 refills | Status: DC | PRN

## 2024-12-04 ENCOUNTER — Encounter (INDEPENDENT_AMBULATORY_CARE_PROVIDER_SITE_OTHER): Payer: Self-pay | Admitting: Family Medicine

## 2024-12-04 ENCOUNTER — Ambulatory Visit (INDEPENDENT_AMBULATORY_CARE_PROVIDER_SITE_OTHER): Admitting: Family Medicine

## 2024-12-04 VITALS — BP 103/77 | HR 73 | Temp 97.9°F | Ht 61.0 in | Wt 179.0 lb

## 2024-12-04 DIAGNOSIS — R110 Nausea: Secondary | ICD-10-CM

## 2024-12-04 DIAGNOSIS — I10 Essential (primary) hypertension: Secondary | ICD-10-CM

## 2024-12-04 DIAGNOSIS — Z6833 Body mass index (BMI) 33.0-33.9, adult: Secondary | ICD-10-CM

## 2024-12-04 MED ORDER — ONDANSETRON HCL 4 MG PO TABS: 4.0000 mg | ORAL_TABLET | Freq: Three times a day (TID) | ORAL | 1 refills | Status: AC | PRN

## 2024-12-04 MED ORDER — WEGOVY 1.7 MG/0.75ML ~~LOC~~ SOAJ: 1.7000 mg | SUBCUTANEOUS | 1 refills | Status: AC

## 2024-12-04 NOTE — Progress Notes (Unsigned)
 "  SUBJECTIVE:  Chief Complaint: Obesity  Interim History: Patient had a great 6 months- she had a good holiday season.  Not much has changed in terms of work and life.  Her lower back pain has significantly improved.  She voices that she has been consistent with her food intake and getting 80 grams of protein daily.  She is sticking to around the same 1400 calories.  She is exercising more and doing the shake plate with resistance bands.  She is not sure what her plans are for the next few weeks.    Sherry Martinez is here to discuss her progress with her obesity treatment plan. She is on the keeping a food journal and adhering to recommended goals of 1400 calories and 85 grams of protein and states she is following her eating plan approximately 100 % of the time. She states she is walking at work and exercising 20 minutes 3 times per week.   OBJECTIVE: Visit Diagnoses: Problem List Items Addressed This Visit       Cardiovascular and Mediastinum   HTN (hypertension) - Primary   Other Visit Diagnoses       Nausea       Relevant Medications   ondansetron  (ZOFRAN ) 4 MG tablet     BMI 33.0-33.9,adult         Obesity with starting BMI of 48.0       Relevant Medications   semaglutide -weight management (WEGOVY ) 1.7 MG/0.75ML SOAJ SQ injection       Vitals Temp: 97.9 F (36.6 C) BP: 103/77 Pulse Rate: 73 SpO2: 97 %   Anthropometric Measurements Height: 5' 1 (1.549 m) Weight: 179 lb (81.2 kg) BMI (Calculated): 33.84 Weight at Last Visit: 196 lb Weight Lost Since Last Visit: 17 lb Weight Gained Since Last Visit: 0 Starting Weight: 254 lb Total Weight Loss (lbs): 75 lb (34 kg)   Body Composition  Body Fat %: 35.9 % Fat Mass (lbs): 64.4 lbs Muscle Mass (lbs): 109.2 lbs Total Body Water (lbs): 77.6 lbs Visceral Fat Rating : 10   Other Clinical Data Today's Visit #: 19 Starting Date: 09/26/22 Comments: 1400/85     ASSESSMENT AND PLAN: Problem List Items Addressed This  Visit       Cardiovascular and Mediastinum   HTN (hypertension) - Primary   Other Visit Diagnoses       Nausea       Relevant Medications   ondansetron  (ZOFRAN ) 4 MG tablet     BMI 33.0-33.9,adult         Obesity with starting BMI of 48.0       Relevant Medications   semaglutide -weight management (WEGOVY ) 1.7 MG/0.75ML SOAJ SQ injection       Diet: Sherry Martinez is currently in the action stage of change. As such, her goal is to continue with weight loss efforts and has agreed to keeping a food journal and adhering to recommended goals of 1400 calories and 85 or more grams of protein daily.   Exercise:  For substantial health benefits, adults should do at least 150 minutes (2 hours and 30 minutes) a week of moderate-intensity, or 75 minutes (1 hour and 15 minutes) a week of vigorous-intensity aerobic physical activity, or an equivalent combination of moderate- and vigorous-intensity aerobic activity. Aerobic activity should be performed in episodes of at least 10 minutes, and preferably, it should be spread throughout the week. and Adults should also include muscle-strengthening activities that involve all major muscle groups on 2 or more days a  week.  Behavior Modification:  We discussed the following Behavioral Modification Strategies today: increasing lean protein intake, decreasing simple carbohydrates, increasing vegetables, meal planning and cooking strategies, keeping healthy foods in the home, and planning for success. We discussed various medication options to help Sherry Martinez with her weight loss efforts and we both agreed to continue wegovy  at current dose.  Return in about 6 months (around 06/03/2025).   She was informed of the importance of frequent follow up visits to maximize her success with intensive lifestyle modifications for her multiple health conditions.  Attestation Statements:   Reviewed by clinician on day of visit: allergies, medications, problem list, medical history,  surgical history, family history, social history, and previous encounter notes.   [name], MD "

## 2025-06-04 ENCOUNTER — Ambulatory Visit (INDEPENDENT_AMBULATORY_CARE_PROVIDER_SITE_OTHER): Admitting: Family Medicine
